# Patient Record
Sex: Male | Born: 1935 | Race: White | Hispanic: No | Marital: Married | Smoking: Former smoker
Health system: Southern US, Community
[De-identification: ages and names within clinical notes are randomized; demographics above are authoritative.]

## PROBLEM LIST (undated history)

## (undated) DIAGNOSIS — N529 Male erectile dysfunction, unspecified: Secondary | ICD-10-CM

## (undated) DIAGNOSIS — I25810 Atherosclerosis of coronary artery bypass graft(s) without angina pectoris: Secondary | ICD-10-CM

## (undated) DIAGNOSIS — Z7901 Long term (current) use of anticoagulants: Secondary | ICD-10-CM

## (undated) DIAGNOSIS — E291 Testicular hypofunction: Secondary | ICD-10-CM

## (undated) DIAGNOSIS — K219 Gastro-esophageal reflux disease without esophagitis: Secondary | ICD-10-CM

## (undated) DIAGNOSIS — I639 Cerebral infarction, unspecified: Secondary | ICD-10-CM

## (undated) DIAGNOSIS — E039 Hypothyroidism, unspecified: Secondary | ICD-10-CM

## (undated) DIAGNOSIS — I10 Essential (primary) hypertension: Secondary | ICD-10-CM

## (undated) DIAGNOSIS — Q245 Malformation of coronary vessels: Secondary | ICD-10-CM

## (undated) DIAGNOSIS — B029 Zoster without complications: Secondary | ICD-10-CM

## (undated) DIAGNOSIS — C439 Malignant melanoma of skin, unspecified: Secondary | ICD-10-CM

## (undated) DIAGNOSIS — I4891 Unspecified atrial fibrillation: Secondary | ICD-10-CM

## (undated) DIAGNOSIS — E785 Hyperlipidemia, unspecified: Secondary | ICD-10-CM

## (undated) DIAGNOSIS — I482 Chronic atrial fibrillation, unspecified: Secondary | ICD-10-CM

## (undated) DIAGNOSIS — H9190 Unspecified hearing loss, unspecified ear: Secondary | ICD-10-CM

## (undated) DIAGNOSIS — Z950 Presence of cardiac pacemaker: Secondary | ICD-10-CM

## (undated) DIAGNOSIS — N400 Enlarged prostate without lower urinary tract symptoms: Secondary | ICD-10-CM

## (undated) HISTORY — DX: Long term (current) use of anticoagulants: Z79.01

## (undated) HISTORY — DX: Malignant melanoma of skin, unspecified: C43.9

## (undated) HISTORY — DX: Hypothyroidism, unspecified: E03.9

## (undated) HISTORY — DX: Chronic atrial fibrillation, unspecified: I48.20

## (undated) HISTORY — DX: Zoster without complications: B02.9

## (undated) HISTORY — DX: Atherosclerosis of coronary artery bypass graft(s) without angina pectoris: I25.810

## (undated) HISTORY — DX: Unspecified hearing loss, unspecified ear: H91.90

## (undated) HISTORY — DX: Benign prostatic hyperplasia without lower urinary tract symptoms: N40.0

## (undated) HISTORY — DX: Male erectile dysfunction, unspecified: N52.9

## (undated) HISTORY — DX: Essential (primary) hypertension: I10

## (undated) HISTORY — DX: Malformation of coronary vessels: Q24.5

## (undated) HISTORY — DX: Testicular hypofunction: E29.1

## (undated) HISTORY — DX: Hyperlipidemia, unspecified: E78.5

## (undated) HISTORY — DX: Cerebral infarction, unspecified: I63.9

## (undated) HISTORY — DX: Gastro-esophageal reflux disease without esophagitis: K21.9

## (undated) HISTORY — DX: Unspecified atrial fibrillation: I48.91

## (undated) HISTORY — DX: Presence of cardiac pacemaker: Z95.0

---

## 1992-03-03 DIAGNOSIS — I25810 Atherosclerosis of coronary artery bypass graft(s) without angina pectoris: Secondary | ICD-10-CM

## 1992-03-03 HISTORY — DX: Atherosclerosis of coronary artery bypass graft(s) without angina pectoris: I25.810

## 1993-03-03 DIAGNOSIS — I639 Cerebral infarction, unspecified: Secondary | ICD-10-CM

## 1993-03-03 HISTORY — DX: Cerebral infarction, unspecified: I63.9

## 1998-03-03 DIAGNOSIS — Q245 Malformation of coronary vessels: Secondary | ICD-10-CM

## 1998-03-03 HISTORY — DX: Malformation of coronary vessels: Q24.5

## 1998-07-05 ENCOUNTER — Ambulatory Visit (HOSPITAL_COMMUNITY): Admission: AD | Admit: 1998-07-05 | Discharge: 1998-07-05 | Payer: Self-pay | Admitting: Interventional Cardiology

## 1998-07-06 ENCOUNTER — Observation Stay (HOSPITAL_COMMUNITY): Admission: AD | Admit: 1998-07-06 | Discharge: 1998-07-07 | Payer: Self-pay | Admitting: Interventional Cardiology

## 1999-01-21 ENCOUNTER — Ambulatory Visit (HOSPITAL_COMMUNITY): Admission: RE | Admit: 1999-01-21 | Discharge: 1999-01-21 | Payer: Self-pay | Admitting: Interventional Cardiology

## 2000-03-16 ENCOUNTER — Ambulatory Visit (HOSPITAL_COMMUNITY): Admission: AD | Admit: 2000-03-16 | Discharge: 2000-03-16 | Payer: Self-pay | Admitting: Cardiology

## 2001-11-26 ENCOUNTER — Ambulatory Visit (HOSPITAL_COMMUNITY): Admission: RE | Admit: 2001-11-26 | Discharge: 2001-11-26 | Payer: Self-pay | Admitting: Gastroenterology

## 2001-11-26 ENCOUNTER — Encounter (INDEPENDENT_AMBULATORY_CARE_PROVIDER_SITE_OTHER): Payer: Self-pay | Admitting: Specialist

## 2005-07-02 ENCOUNTER — Encounter: Payer: Self-pay | Admitting: Interventional Cardiology

## 2007-07-07 ENCOUNTER — Ambulatory Visit (HOSPITAL_COMMUNITY): Admission: RE | Admit: 2007-07-07 | Discharge: 2007-07-07 | Payer: Self-pay | Admitting: Cardiology

## 2010-05-10 ENCOUNTER — Other Ambulatory Visit: Payer: Self-pay | Admitting: Dermatology

## 2010-07-16 NOTE — Cardiovascular Report (Signed)
Timothy Harper, WHANG NO.:  [id number]   MEDICAL RECORD NO.:  [id number]          PATIENT TYPE:  OIB   LOCATION:  2899                         FACILITY:  [facility]   PHYSICIAN:  Francisca December, M.D.  DATE OF BIRTH:  05-Jun-1932   DATE OF PROCEDURE:  DATE OF DISCHARGE:                            CARDIAC CATHETERIZATION   PROCEDURE PERFORMED:  1. Explant old pacing generator.  2. Insert a new permanent dual-chamber permanent transvenous      pacemaker.   INDICATIONS:  The patient is a 77 year old man with a Medtronic  pacemaker model #[id number], serial [id number] implanted on March 16, 2000, now at the [facility].  Initial diagnosis was atrial fibrillation with  tachybrady syndrome.  This is his third device.  The initial device was  implanted in 1995.   PROCEDURE NOTE:  The patient is brought to cardiac catheterization  laboratory in the fasting state.  The left prepectoral region was  prepped and draped in the usual sterile fashion.  Local anesthesia was  obtained with infiltration of 1% lidocaine with epinephrine throughout  the region above the pacemaker and along the incision site.   A 6-7 cm incision was then made over the previous incision site and this  was carried down by sharp and blunt dissection as well as electrocautery  to the pacemaker capsule.  The capsule was incised and delivered with  some difficulty.  There was extensive scarring.  The pacemaker was  detached in the pacing leads and the leads were tested for adequate  pacing parameters.  This is reported below.  The pocket was then  copiously irrigated using 1% kanamycin solution.  The new pacing  generator was attached to the pacing leads, carefully identifying each  by serial number and placing each into the probe receptacle.  Each lead  was tightened into place and tested for security.  The pacing generator  was then replaced into the pocket without any need for pocket revision  or enlargement.   The pocket was then closed using 2-0 Vicryl in a  running fashion with subcutaneous layer.  The skin was approximated  using 4-0 Vicryl in a running subcuticular fashion.  Steri-Strips and  sterile dressing were applied.  The patient is transported to the  recovery area in stable condition.   EQUIPMENT DATA:  The explanted device is as noted above.  The new  implanted device is a Medtronic Adapta [id number] #1 [id number].   PACING DATA:  The ventricular lead detected an 8.5 mV R wave.  The  pacing threshold was 1.2 volts at 0.5 milliseconds pulse width.  The  impedance was 565 ohms resulting in a current at capture threshold of  2.5 MA.  The atrial lead detected a 5.1 mV P-wave.  The pacing threshold  was 1.1 volts at 0.5 milliseconds pulse width.  The impedance was 585  ohms resulting in a current and capture threshold of 2.9 MA.      Francisca December, M.D.  Electronically Signed     JHE/MEDQ  D:  07/07/2007  T:  07/08/2007  Job:  [id number]

## 2010-07-19 NOTE — Procedures (Signed)
[facility]  Patient:    SUTTON, HIRSCH                   MRN: [id number] Proc. Date: 03/16/00 Adm. Date:  [id number] Disc. Date: [id number] Attending:  Armanda Magic CC:         Darci Needle, M.D.             Thora Lance, M.D.                           Procedure Report  INDICATIONS:  This is a 77 year old white male patient followed by Darci Needle, M.D., with a history of coronary artery disease, status post PTCA and stent to the right coronary artery in May 2000.  He also is status post coronary artery bypass grafting in 1994.  He has a history of tachybrady syndrome with a history of paroxysmal atrial fibrillation and is status post DDD pacemaker placement in 1991.  He was found recently to have elective replacement of his batter generator and now presents for generator change-out.  DESCRIPTION OF PROCEDURE:  The patient was brought to the cardiac catheterization lab in the fasting, nonsedated state.  Informed consent was obtained.  The patient was connected to continuous heart rate and pulse oximetry monitoring and intermittent blood pressure monitoring.  The left chest was prepped and draped in a sterile fashion.  Lidocaine 1% was used for local anesthesia.  After adequate anesthesia had been obtained and the patient was given adequate conscious sedation with 5 mg of Valium, an incision was made over the previous incision.  Using a combination of blunt dissection and electrocautery, the pacemaker pocket was opened to expose the battery generator.  After the generator was removed from the pocket, the leads were disconnected and the leads examined.  They appeared to be intact.  Testing of the leads was then performed.  The atrial threshold was 1.2 volts at 0.5 milliseconds.  Atrial lead impedance 522 Ohms.  P-wave width of 2.54 millivolts.  Current 3.1 MA.  Ventricular threshold was 1.3 volts at 0.5 milliseconds.  Ventricular  lead impedance 598 Ohms.  R-wave amplitude 7.1 millivolts and current 2.7 MA.  The atrial lead is a model number [id number], S/N, serial number [id number], initially implanted 03/29/93.  Ventricular lead was a model number [id number], serial number [id number], implanted 03/29/93.  The pocket was then washed out with a solution of polymyxin B and bacitracin.  After adequate hemostasis was obtained, a new generator, which was a Medtronic Kappa [id number], serial number [id number], was connected back to the pacemaker leads. The leads were placed underneath the generator, and the entire device was placed back into the pocket.  The pocket was then closed.  The fascial layer was closed with 0 silk.  The subcutaneous layers were closed with 3-0 and 2-0 Vicryl, and the subcuticular layer was closed with 5-0 Vicryl.  At the end of the procedure, the patient was transferred back to his room in stable condition.  PLAN:  He will be discharged later today.  Restart Coumadin in 24 hours.  He will have a repeat PT/INR check in one week and follow up with Dr. Mayford Knife in one week. DD:  04/02/00 TD:  04/02/00 Job: [id number]

## 2011-05-13 ENCOUNTER — Other Ambulatory Visit: Payer: Self-pay | Admitting: Dermatology

## 2011-05-13 DIAGNOSIS — L578 Other skin changes due to chronic exposure to nonionizing radiation: Secondary | ICD-10-CM | POA: Diagnosis not present

## 2011-05-13 DIAGNOSIS — I4891 Unspecified atrial fibrillation: Secondary | ICD-10-CM | POA: Diagnosis not present

## 2011-05-13 DIAGNOSIS — L570 Actinic keratosis: Secondary | ICD-10-CM | POA: Diagnosis not present

## 2011-06-16 DIAGNOSIS — E039 Hypothyroidism, unspecified: Secondary | ICD-10-CM | POA: Diagnosis not present

## 2011-06-16 DIAGNOSIS — Z1331 Encounter for screening for depression: Secondary | ICD-10-CM | POA: Diagnosis not present

## 2011-06-16 DIAGNOSIS — N529 Male erectile dysfunction, unspecified: Secondary | ICD-10-CM | POA: Diagnosis not present

## 2011-06-16 DIAGNOSIS — E291 Testicular hypofunction: Secondary | ICD-10-CM | POA: Diagnosis not present

## 2011-06-16 DIAGNOSIS — I10 Essential (primary) hypertension: Secondary | ICD-10-CM | POA: Diagnosis not present

## 2011-06-16 DIAGNOSIS — K219 Gastro-esophageal reflux disease without esophagitis: Secondary | ICD-10-CM | POA: Diagnosis not present

## 2011-06-16 DIAGNOSIS — N400 Enlarged prostate without lower urinary tract symptoms: Secondary | ICD-10-CM | POA: Diagnosis not present

## 2011-06-16 DIAGNOSIS — E785 Hyperlipidemia, unspecified: Secondary | ICD-10-CM | POA: Diagnosis not present

## 2011-06-18 DIAGNOSIS — I10 Essential (primary) hypertension: Secondary | ICD-10-CM | POA: Diagnosis not present

## 2011-06-30 DIAGNOSIS — L821 Other seborrheic keratosis: Secondary | ICD-10-CM | POA: Diagnosis not present

## 2011-06-30 DIAGNOSIS — L570 Actinic keratosis: Secondary | ICD-10-CM | POA: Diagnosis not present

## 2011-06-30 DIAGNOSIS — Z85828 Personal history of other malignant neoplasm of skin: Secondary | ICD-10-CM | POA: Diagnosis not present

## 2011-06-30 DIAGNOSIS — L578 Other skin changes due to chronic exposure to nonionizing radiation: Secondary | ICD-10-CM | POA: Diagnosis not present

## 2011-06-30 DIAGNOSIS — I4891 Unspecified atrial fibrillation: Secondary | ICD-10-CM | POA: Diagnosis not present

## 2011-07-01 DIAGNOSIS — I2510 Atherosclerotic heart disease of native coronary artery without angina pectoris: Secondary | ICD-10-CM | POA: Diagnosis not present

## 2011-07-01 DIAGNOSIS — R413 Other amnesia: Secondary | ICD-10-CM | POA: Diagnosis not present

## 2011-07-01 DIAGNOSIS — Z7901 Long term (current) use of anticoagulants: Secondary | ICD-10-CM | POA: Diagnosis not present

## 2011-07-01 DIAGNOSIS — Z950 Presence of cardiac pacemaker: Secondary | ICD-10-CM | POA: Diagnosis not present

## 2011-07-01 DIAGNOSIS — I10 Essential (primary) hypertension: Secondary | ICD-10-CM | POA: Diagnosis not present

## 2011-07-01 DIAGNOSIS — I5030 Unspecified diastolic (congestive) heart failure: Secondary | ICD-10-CM | POA: Diagnosis not present

## 2011-07-01 DIAGNOSIS — I4891 Unspecified atrial fibrillation: Secondary | ICD-10-CM | POA: Diagnosis not present

## 2011-08-28 DIAGNOSIS — Z7901 Long term (current) use of anticoagulants: Secondary | ICD-10-CM | POA: Diagnosis not present

## 2011-09-08 DIAGNOSIS — Z950 Presence of cardiac pacemaker: Secondary | ICD-10-CM | POA: Diagnosis not present

## 2011-09-29 DIAGNOSIS — L570 Actinic keratosis: Secondary | ICD-10-CM | POA: Diagnosis not present

## 2011-09-29 DIAGNOSIS — L578 Other skin changes due to chronic exposure to nonionizing radiation: Secondary | ICD-10-CM | POA: Diagnosis not present

## 2011-11-12 DIAGNOSIS — Z7901 Long term (current) use of anticoagulants: Secondary | ICD-10-CM | POA: Diagnosis not present

## 2011-11-21 DIAGNOSIS — Z23 Encounter for immunization: Secondary | ICD-10-CM | POA: Diagnosis not present

## 2011-12-15 DIAGNOSIS — Z950 Presence of cardiac pacemaker: Secondary | ICD-10-CM | POA: Diagnosis not present

## 2011-12-15 DIAGNOSIS — I10 Essential (primary) hypertension: Secondary | ICD-10-CM | POA: Diagnosis not present

## 2011-12-15 DIAGNOSIS — N529 Male erectile dysfunction, unspecified: Secondary | ICD-10-CM | POA: Diagnosis not present

## 2011-12-15 DIAGNOSIS — E291 Testicular hypofunction: Secondary | ICD-10-CM | POA: Diagnosis not present

## 2011-12-29 DIAGNOSIS — L819 Disorder of pigmentation, unspecified: Secondary | ICD-10-CM | POA: Diagnosis not present

## 2011-12-29 DIAGNOSIS — L570 Actinic keratosis: Secondary | ICD-10-CM | POA: Diagnosis not present

## 2011-12-29 DIAGNOSIS — L821 Other seborrheic keratosis: Secondary | ICD-10-CM | POA: Diagnosis not present

## 2011-12-29 DIAGNOSIS — L578 Other skin changes due to chronic exposure to nonionizing radiation: Secondary | ICD-10-CM | POA: Diagnosis not present

## 2012-01-07 DIAGNOSIS — Z7901 Long term (current) use of anticoagulants: Secondary | ICD-10-CM | POA: Diagnosis not present

## 2012-01-08 DIAGNOSIS — Z7901 Long term (current) use of anticoagulants: Secondary | ICD-10-CM | POA: Diagnosis not present

## 2012-02-23 DIAGNOSIS — H40019 Open angle with borderline findings, low risk, unspecified eye: Secondary | ICD-10-CM | POA: Diagnosis not present

## 2012-04-02 DIAGNOSIS — L819 Disorder of pigmentation, unspecified: Secondary | ICD-10-CM | POA: Diagnosis not present

## 2012-04-02 DIAGNOSIS — L570 Actinic keratosis: Secondary | ICD-10-CM | POA: Diagnosis not present

## 2012-04-02 DIAGNOSIS — L578 Other skin changes due to chronic exposure to nonionizing radiation: Secondary | ICD-10-CM | POA: Diagnosis not present

## 2012-04-02 DIAGNOSIS — L821 Other seborrheic keratosis: Secondary | ICD-10-CM | POA: Diagnosis not present

## 2012-04-06 DIAGNOSIS — I4891 Unspecified atrial fibrillation: Secondary | ICD-10-CM | POA: Diagnosis not present

## 2012-04-06 DIAGNOSIS — Z950 Presence of cardiac pacemaker: Secondary | ICD-10-CM | POA: Diagnosis not present

## 2012-05-28 DIAGNOSIS — I4891 Unspecified atrial fibrillation: Secondary | ICD-10-CM | POA: Diagnosis not present

## 2012-06-28 DIAGNOSIS — L578 Other skin changes due to chronic exposure to nonionizing radiation: Secondary | ICD-10-CM | POA: Diagnosis not present

## 2012-06-28 DIAGNOSIS — L821 Other seborrheic keratosis: Secondary | ICD-10-CM | POA: Diagnosis not present

## 2012-06-28 DIAGNOSIS — L219 Seborrheic dermatitis, unspecified: Secondary | ICD-10-CM | POA: Diagnosis not present

## 2012-06-28 DIAGNOSIS — Z85828 Personal history of other malignant neoplasm of skin: Secondary | ICD-10-CM | POA: Diagnosis not present

## 2012-06-28 DIAGNOSIS — L738 Other specified follicular disorders: Secondary | ICD-10-CM | POA: Diagnosis not present

## 2012-06-28 DIAGNOSIS — L570 Actinic keratosis: Secondary | ICD-10-CM | POA: Diagnosis not present

## 2012-06-28 DIAGNOSIS — L819 Disorder of pigmentation, unspecified: Secondary | ICD-10-CM | POA: Diagnosis not present

## 2012-06-28 DIAGNOSIS — D1801 Hemangioma of skin and subcutaneous tissue: Secondary | ICD-10-CM | POA: Diagnosis not present

## 2012-06-30 DIAGNOSIS — E291 Testicular hypofunction: Secondary | ICD-10-CM | POA: Diagnosis not present

## 2012-06-30 DIAGNOSIS — Z0000 Encounter for general adult medical examination without abnormal findings: Secondary | ICD-10-CM | POA: Diagnosis not present

## 2012-06-30 DIAGNOSIS — Z7901 Long term (current) use of anticoagulants: Secondary | ICD-10-CM | POA: Diagnosis not present

## 2012-06-30 DIAGNOSIS — E785 Hyperlipidemia, unspecified: Secondary | ICD-10-CM | POA: Diagnosis not present

## 2012-06-30 DIAGNOSIS — Z1331 Encounter for screening for depression: Secondary | ICD-10-CM | POA: Diagnosis not present

## 2012-06-30 DIAGNOSIS — N400 Enlarged prostate without lower urinary tract symptoms: Secondary | ICD-10-CM | POA: Diagnosis not present

## 2012-06-30 DIAGNOSIS — E039 Hypothyroidism, unspecified: Secondary | ICD-10-CM | POA: Diagnosis not present

## 2012-06-30 DIAGNOSIS — I10 Essential (primary) hypertension: Secondary | ICD-10-CM | POA: Diagnosis not present

## 2012-08-04 DIAGNOSIS — I10 Essential (primary) hypertension: Secondary | ICD-10-CM | POA: Diagnosis not present

## 2012-08-04 DIAGNOSIS — Z7901 Long term (current) use of anticoagulants: Secondary | ICD-10-CM | POA: Diagnosis not present

## 2012-08-04 DIAGNOSIS — Z950 Presence of cardiac pacemaker: Secondary | ICD-10-CM | POA: Diagnosis not present

## 2012-08-04 DIAGNOSIS — I4891 Unspecified atrial fibrillation: Secondary | ICD-10-CM | POA: Diagnosis not present

## 2012-08-04 DIAGNOSIS — E785 Hyperlipidemia, unspecified: Secondary | ICD-10-CM | POA: Diagnosis not present

## 2012-08-04 DIAGNOSIS — I2510 Atherosclerotic heart disease of native coronary artery without angina pectoris: Secondary | ICD-10-CM | POA: Diagnosis not present

## 2012-08-05 DIAGNOSIS — Z7901 Long term (current) use of anticoagulants: Secondary | ICD-10-CM | POA: Diagnosis not present

## 2012-08-20 DIAGNOSIS — L578 Other skin changes due to chronic exposure to nonionizing radiation: Secondary | ICD-10-CM | POA: Diagnosis not present

## 2012-08-20 DIAGNOSIS — L570 Actinic keratosis: Secondary | ICD-10-CM | POA: Diagnosis not present

## 2012-08-20 DIAGNOSIS — L820 Inflamed seborrheic keratosis: Secondary | ICD-10-CM | POA: Diagnosis not present

## 2012-08-20 DIAGNOSIS — Z85828 Personal history of other malignant neoplasm of skin: Secondary | ICD-10-CM | POA: Diagnosis not present

## 2012-08-20 DIAGNOSIS — L821 Other seborrheic keratosis: Secondary | ICD-10-CM | POA: Diagnosis not present

## 2012-09-09 DIAGNOSIS — Z7901 Long term (current) use of anticoagulants: Secondary | ICD-10-CM | POA: Diagnosis not present

## 2012-10-27 DIAGNOSIS — Z7901 Long term (current) use of anticoagulants: Secondary | ICD-10-CM | POA: Diagnosis not present

## 2012-11-03 DIAGNOSIS — Z950 Presence of cardiac pacemaker: Secondary | ICD-10-CM | POA: Diagnosis not present

## 2012-11-03 DIAGNOSIS — I495 Sick sinus syndrome: Secondary | ICD-10-CM | POA: Diagnosis not present

## 2012-11-19 ENCOUNTER — Other Ambulatory Visit: Payer: Self-pay | Admitting: Dermatology

## 2012-11-19 DIAGNOSIS — D045 Carcinoma in situ of skin of trunk: Secondary | ICD-10-CM | POA: Diagnosis not present

## 2012-11-19 DIAGNOSIS — Z85828 Personal history of other malignant neoplasm of skin: Secondary | ICD-10-CM | POA: Diagnosis not present

## 2012-11-19 DIAGNOSIS — L259 Unspecified contact dermatitis, unspecified cause: Secondary | ICD-10-CM | POA: Diagnosis not present

## 2012-11-19 DIAGNOSIS — L570 Actinic keratosis: Secondary | ICD-10-CM | POA: Diagnosis not present

## 2012-11-19 DIAGNOSIS — L821 Other seborrheic keratosis: Secondary | ICD-10-CM | POA: Diagnosis not present

## 2012-11-19 DIAGNOSIS — K130 Diseases of lips: Secondary | ICD-10-CM | POA: Diagnosis not present

## 2012-11-22 ENCOUNTER — Other Ambulatory Visit: Payer: Self-pay | Admitting: Dermatology

## 2012-11-22 DIAGNOSIS — D485 Neoplasm of uncertain behavior of skin: Secondary | ICD-10-CM | POA: Diagnosis not present

## 2012-11-22 DIAGNOSIS — L909 Atrophic disorder of skin, unspecified: Secondary | ICD-10-CM | POA: Diagnosis not present

## 2012-11-22 DIAGNOSIS — Z85828 Personal history of other malignant neoplasm of skin: Secondary | ICD-10-CM | POA: Diagnosis not present

## 2012-12-02 DIAGNOSIS — Z7901 Long term (current) use of anticoagulants: Secondary | ICD-10-CM | POA: Diagnosis not present

## 2012-12-04 ENCOUNTER — Other Ambulatory Visit: Payer: Self-pay | Admitting: Interventional Cardiology

## 2012-12-13 DIAGNOSIS — Z23 Encounter for immunization: Secondary | ICD-10-CM | POA: Diagnosis not present

## 2012-12-29 DIAGNOSIS — I10 Essential (primary) hypertension: Secondary | ICD-10-CM | POA: Diagnosis not present

## 2012-12-29 DIAGNOSIS — E291 Testicular hypofunction: Secondary | ICD-10-CM | POA: Diagnosis not present

## 2012-12-29 DIAGNOSIS — E785 Hyperlipidemia, unspecified: Secondary | ICD-10-CM | POA: Diagnosis not present

## 2012-12-29 DIAGNOSIS — Z7901 Long term (current) use of anticoagulants: Secondary | ICD-10-CM | POA: Diagnosis not present

## 2012-12-29 DIAGNOSIS — I4891 Unspecified atrial fibrillation: Secondary | ICD-10-CM | POA: Diagnosis not present

## 2013-02-02 DIAGNOSIS — E785 Hyperlipidemia, unspecified: Secondary | ICD-10-CM | POA: Diagnosis not present

## 2013-02-02 DIAGNOSIS — Z7901 Long term (current) use of anticoagulants: Secondary | ICD-10-CM | POA: Diagnosis not present

## 2013-02-18 ENCOUNTER — Encounter: Payer: Self-pay | Admitting: *Deleted

## 2013-03-01 DIAGNOSIS — H40019 Open angle with borderline findings, low risk, unspecified eye: Secondary | ICD-10-CM | POA: Diagnosis not present

## 2013-03-01 DIAGNOSIS — Z0100 Encounter for examination of eyes and vision without abnormal findings: Secondary | ICD-10-CM | POA: Diagnosis not present

## 2013-03-18 DIAGNOSIS — Z7901 Long term (current) use of anticoagulants: Secondary | ICD-10-CM | POA: Diagnosis not present

## 2013-04-13 ENCOUNTER — Other Ambulatory Visit: Payer: Self-pay | Admitting: Dermatology

## 2013-04-13 DIAGNOSIS — C44211 Basal cell carcinoma of skin of unspecified ear and external auricular canal: Secondary | ICD-10-CM | POA: Diagnosis not present

## 2013-04-13 DIAGNOSIS — C44319 Basal cell carcinoma of skin of other parts of face: Secondary | ICD-10-CM | POA: Diagnosis not present

## 2013-04-13 DIAGNOSIS — L570 Actinic keratosis: Secondary | ICD-10-CM | POA: Diagnosis not present

## 2013-04-13 DIAGNOSIS — L821 Other seborrheic keratosis: Secondary | ICD-10-CM | POA: Diagnosis not present

## 2013-04-13 DIAGNOSIS — Z85828 Personal history of other malignant neoplasm of skin: Secondary | ICD-10-CM | POA: Diagnosis not present

## 2013-05-05 DIAGNOSIS — Z5181 Encounter for therapeutic drug level monitoring: Secondary | ICD-10-CM | POA: Diagnosis not present

## 2013-05-05 DIAGNOSIS — Z7901 Long term (current) use of anticoagulants: Secondary | ICD-10-CM | POA: Diagnosis not present

## 2013-05-25 DIAGNOSIS — Z85828 Personal history of other malignant neoplasm of skin: Secondary | ICD-10-CM | POA: Diagnosis not present

## 2013-05-25 DIAGNOSIS — C44211 Basal cell carcinoma of skin of unspecified ear and external auricular canal: Secondary | ICD-10-CM | POA: Diagnosis not present

## 2013-06-22 DIAGNOSIS — I4891 Unspecified atrial fibrillation: Secondary | ICD-10-CM | POA: Diagnosis not present

## 2013-06-22 DIAGNOSIS — Z7901 Long term (current) use of anticoagulants: Secondary | ICD-10-CM | POA: Diagnosis not present

## 2013-06-30 DIAGNOSIS — Z23 Encounter for immunization: Secondary | ICD-10-CM | POA: Diagnosis not present

## 2013-06-30 DIAGNOSIS — I10 Essential (primary) hypertension: Secondary | ICD-10-CM | POA: Diagnosis not present

## 2013-06-30 DIAGNOSIS — E785 Hyperlipidemia, unspecified: Secondary | ICD-10-CM | POA: Diagnosis not present

## 2013-06-30 DIAGNOSIS — K219 Gastro-esophageal reflux disease without esophagitis: Secondary | ICD-10-CM | POA: Diagnosis not present

## 2013-06-30 DIAGNOSIS — E039 Hypothyroidism, unspecified: Secondary | ICD-10-CM | POA: Diagnosis not present

## 2013-06-30 DIAGNOSIS — N529 Male erectile dysfunction, unspecified: Secondary | ICD-10-CM | POA: Diagnosis not present

## 2013-06-30 DIAGNOSIS — E291 Testicular hypofunction: Secondary | ICD-10-CM | POA: Diagnosis not present

## 2013-06-30 DIAGNOSIS — Z79899 Other long term (current) drug therapy: Secondary | ICD-10-CM | POA: Diagnosis not present

## 2013-07-11 DIAGNOSIS — L821 Other seborrheic keratosis: Secondary | ICD-10-CM | POA: Diagnosis not present

## 2013-07-11 DIAGNOSIS — L570 Actinic keratosis: Secondary | ICD-10-CM | POA: Diagnosis not present

## 2013-07-11 DIAGNOSIS — D239 Other benign neoplasm of skin, unspecified: Secondary | ICD-10-CM | POA: Diagnosis not present

## 2013-07-11 DIAGNOSIS — D1801 Hemangioma of skin and subcutaneous tissue: Secondary | ICD-10-CM | POA: Diagnosis not present

## 2013-07-11 DIAGNOSIS — Z85828 Personal history of other malignant neoplasm of skin: Secondary | ICD-10-CM | POA: Diagnosis not present

## 2013-07-26 DIAGNOSIS — E039 Hypothyroidism, unspecified: Secondary | ICD-10-CM | POA: Diagnosis not present

## 2013-07-26 DIAGNOSIS — Z125 Encounter for screening for malignant neoplasm of prostate: Secondary | ICD-10-CM | POA: Diagnosis not present

## 2013-07-26 DIAGNOSIS — I10 Essential (primary) hypertension: Secondary | ICD-10-CM | POA: Diagnosis not present

## 2013-07-26 DIAGNOSIS — Z79899 Other long term (current) drug therapy: Secondary | ICD-10-CM | POA: Diagnosis not present

## 2013-07-26 DIAGNOSIS — I4891 Unspecified atrial fibrillation: Secondary | ICD-10-CM | POA: Diagnosis not present

## 2013-07-26 DIAGNOSIS — E785 Hyperlipidemia, unspecified: Secondary | ICD-10-CM | POA: Diagnosis not present

## 2013-07-26 DIAGNOSIS — Z7901 Long term (current) use of anticoagulants: Secondary | ICD-10-CM | POA: Diagnosis not present

## 2013-08-02 ENCOUNTER — Encounter: Payer: Self-pay | Admitting: *Deleted

## 2013-08-03 ENCOUNTER — Ambulatory Visit: Payer: Self-pay | Admitting: Interventional Cardiology

## 2013-08-04 ENCOUNTER — Encounter: Payer: Self-pay | Admitting: Interventional Cardiology

## 2013-08-04 ENCOUNTER — Ambulatory Visit (INDEPENDENT_AMBULATORY_CARE_PROVIDER_SITE_OTHER): Payer: Medicare Other | Admitting: Interventional Cardiology

## 2013-08-04 ENCOUNTER — Encounter (INDEPENDENT_AMBULATORY_CARE_PROVIDER_SITE_OTHER): Payer: Self-pay

## 2013-08-04 ENCOUNTER — Ambulatory Visit (INDEPENDENT_AMBULATORY_CARE_PROVIDER_SITE_OTHER): Payer: Medicare Other | Admitting: *Deleted

## 2013-08-04 VITALS — BP 110/80 | HR 61 | Ht 71.0 in | Wt 215.0 lb

## 2013-08-04 DIAGNOSIS — I4891 Unspecified atrial fibrillation: Secondary | ICD-10-CM

## 2013-08-04 DIAGNOSIS — I482 Chronic atrial fibrillation, unspecified: Secondary | ICD-10-CM

## 2013-08-04 DIAGNOSIS — I495 Sick sinus syndrome: Secondary | ICD-10-CM

## 2013-08-04 DIAGNOSIS — I639 Cerebral infarction, unspecified: Secondary | ICD-10-CM

## 2013-08-04 DIAGNOSIS — Z950 Presence of cardiac pacemaker: Secondary | ICD-10-CM

## 2013-08-04 DIAGNOSIS — I6340 Cerebral infarction due to embolism of unspecified cerebral artery: Secondary | ICD-10-CM

## 2013-08-04 DIAGNOSIS — I25810 Atherosclerosis of coronary artery bypass graft(s) without angina pectoris: Secondary | ICD-10-CM

## 2013-08-04 DIAGNOSIS — I10 Essential (primary) hypertension: Secondary | ICD-10-CM

## 2013-08-04 LAB — MDC_IDC_ENUM_SESS_TYPE_INCLINIC
Battery Impedance: 360 Ohm
Battery Voltage: 2.78 V
Date Time Interrogation Session: 20150604132348
Lead Channel Impedance Value: 626 Ohm
Lead Channel Impedance Value: 67 Ohm
Lead Channel Pacing Threshold Amplitude: 0.75 V
Lead Channel Pacing Threshold Pulse Width: 0.4 ms
Lead Channel Sensing Intrinsic Amplitude: 11.2 mV
MDC IDC MSMT BATTERY REMAINING LONGEVITY: 126 mo
MDC IDC SET LEADCHNL RV PACING AMPLITUDE: 2.5 V
MDC IDC SET LEADCHNL RV PACING PULSEWIDTH: 0.4 ms
MDC IDC SET LEADCHNL RV SENSING SENSITIVITY: 4 mV
MDC IDC STAT BRADY RV PERCENT PACED: 50 %

## 2013-08-04 NOTE — Progress Notes (Signed)
Patient ID: Timothy Harper, male   DOB: [birth year], 80 y.o.   MRN: [id number]    [address], [place],   [address] Phone: [phone] Fax:  [phone]  Date:  08/04/2013   ID:  Timothy Harper, DOB 11-24-1932, MRN [id number]  PCP:  Irven Shelling, MD   ASSESSMENT:  1. Coronary atherosclerotic heart disease, stable with prior the LIMA to RCA and stent to LAD. Asymptomatic 2. Chronic atrial fibrillation 3. Chronic anticoagulation 4. Hypertension 5. Permanent pacemaker  PLAN:  1. Overall [name] well. He has no cardiovascular complaints. 2. Continue to followup in Coumadin and device clinic 3. Continue active lifestyle 4. Clinical followup in one year   SUBJECTIVE: Timothy Harper is a 80 y.o. male is doing well. He denies angina, neurological complaints, and bleeding on anticoagulation therapy. No episodes of syncope. He is actively engaging in his avocation of golf. He denies orthopnea and PND.   Wt Readings from Last 3 Encounters:  08/04/13 215 lb (97.523 kg)     Past Medical History  Diagnosis Date  . Coronary artery disease involving coronary bypass graft 1994    LIMA to RCA . Prior PCI on RCA with restenosis  . Anomalous origin of coronary artery 2000    Stent LAD   . Atrial fibrillation   . HTN (hypertension)   . Hypothyroidism   . Hyperlipemia   . CVA (cerebral infarction) 1995  . GERD (gastroesophageal reflux disease)   . BPH (benign prostatic hyperplasia)   . Erectile dysfunction   . Cardiac pacemaker in situ   . Long term (current) use of anticoagulants   . Primary male hypogonadism   . CVA (cerebral infarction)     Right  . Hearing loss     hearing aids  . Melanoma     right ear, Rosana Hoes 2002 and left arm 1997, [place]  . Herpes zoster     Current Outpatient Prescriptions  Medication Sig Dispense Refill  . DIGOX 250 MCG tablet TAKE 1 TABLET DAILY.  30 tablet  10  . diltiazem (TIAZAC) 180 MG 24  hr capsule Take 180 mg by mouth daily.      . furosemide (LASIX) 20 MG tablet Take 20 mg by mouth daily.      Marland Kitchen levothyroxine (SYNTHROID, LEVOTHROID) 112 MCG tablet Take 112 mcg by mouth daily before breakfast.      . Magnesium Oxide (MAG-200) 200 MG TABS Take 200 mg by mouth.      . metoprolol succinate (TOPROL-XL) 50 MG 24 hr tablet Take 50 mg by mouth 2 (two) times daily. Take with or immediately following a meal.      . omeprazole (PRILOSEC) 20 MG capsule Take 20 mg by mouth as needed.      . potassium chloride SA (K-DUR,KLOR-CON) 20 MEQ tablet Take 20 mEq by mouth daily.      . pravastatin (PRAVACHOL) 40 MG tablet Take 40 mg by mouth daily.      . sildenafil (VIAGRA) 100 MG tablet Take 100 mg by mouth daily as needed for erectile dysfunction.      . Testosterone (FORTESTA) 10 MG/ACT (2%) GEL Place onto the skin. 4 pumps daily      . vardenafil (LEVITRA) 20 MG tablet Take 20 mg by mouth daily as needed for erectile dysfunction.      Marland Kitchen warfarin (COUMADIN) 5 MG tablet Take 5 mg by mouth as needed.  No current facility-administered medications for this visit.    Allergies:    Allergies  Allergen Reactions  . Penicillins     Swollen face/lips  . Prednisone     swelling    Social History:  The patient  reports that he quit smoking about 25 years ago. He does not have any smokeless tobacco history on file. He reports that he drinks alcohol. He reports that he does not use illicit drugs.   ROS:  Please see the history of present illness.   Takes his medications as prescribed. He has not had claudication or edema.   All other systems reviewed and negative.   OBJECTIVE: VS:  BP 110/80  Pulse 61  Ht 5\' 11"  (1.803 m)  Wt 215 lb (97.523 kg)  BMI 30.00 kg/m2 Well nourished, well developed, in no acute distress, appears than stated age  30: normal Neck: JVD flat. Carotid bruit absent  Cardiac:  normal S1, S2; RRR; no murmur Lungs:  clear to auscultation bilaterally, no wheezing,  rhonchi or rales Abd: soft, nontender, no hepatomegaly Ext: Edema absent. Pulses 2+ and symmetric  Skin: warm and dry Neuro:  CNs 2-12 intact, no focal abnormalities noted  EKG:  Baseline atrial fibrillation with ventricular pacing       Signed, Illene Labrador III, MD 08/04/2013 9:58 AM

## 2013-08-04 NOTE — Patient Instructions (Addendum)
Your physician recommends that you continue on your current medications as directed. Please refer to the Current Medication list given to you today.  Your physician discussed the importance of regular exercise and recommended that you start or continue a regular exercise program for good health.   Your physician wants you to follow-up in: 1 year You will receive a reminder letter in the mail two months in advance. If you don't receive a letter, please call our office to schedule the follow-up appointment.  

## 2013-08-05 NOTE — Progress Notes (Signed)
Pacemaker check in clinic. Normal device function. Threshold, sensing, and impedance consistent with previous measurements. Device programmed to maximize longevity. 1,027 high ventricular rates noted---max dur. 3 mins 52 sec, Max V 274, Max Avg V 175---AF w/RVR. Device programmed at appropriate safety margins. Histogram distribution appropriate for patient activity level. Device programmed to optimize intrinsic conduction. Estimated longevity 10.5 years. Patient will follow up with GT in 3 months

## 2013-08-14 ENCOUNTER — Other Ambulatory Visit: Payer: Self-pay | Admitting: Interventional Cardiology

## 2013-08-18 ENCOUNTER — Encounter: Payer: Self-pay | Admitting: Internal Medicine

## 2013-09-23 ENCOUNTER — Other Ambulatory Visit: Payer: Self-pay | Admitting: Interventional Cardiology

## 2013-10-28 ENCOUNTER — Other Ambulatory Visit: Payer: Self-pay | Admitting: Interventional Cardiology

## 2013-11-02 ENCOUNTER — Encounter: Payer: Self-pay | Admitting: Internal Medicine

## 2013-11-02 ENCOUNTER — Ambulatory Visit (INDEPENDENT_AMBULATORY_CARE_PROVIDER_SITE_OTHER): Payer: Medicare Other | Admitting: Internal Medicine

## 2013-11-02 VITALS — BP 124/70 | HR 78 | Ht 71.0 in | Wt 217.0 lb

## 2013-11-02 DIAGNOSIS — I4891 Unspecified atrial fibrillation: Secondary | ICD-10-CM | POA: Diagnosis not present

## 2013-11-02 DIAGNOSIS — I482 Chronic atrial fibrillation, unspecified: Secondary | ICD-10-CM

## 2013-11-02 DIAGNOSIS — Z950 Presence of cardiac pacemaker: Secondary | ICD-10-CM | POA: Diagnosis not present

## 2013-11-02 DIAGNOSIS — I495 Sick sinus syndrome: Secondary | ICD-10-CM

## 2013-11-02 LAB — MDC_IDC_ENUM_SESS_TYPE_INCLINIC
Battery Impedance: 385 Ohm
Battery Voltage: 2.78 V
Brady Statistic RV Percent Paced: 53 %
Date Time Interrogation Session: 20150902122641
Lead Channel Impedance Value: 615 Ohm
Lead Channel Pacing Threshold Amplitude: 0.75 V
Lead Channel Pacing Threshold Pulse Width: 0.27 ms
Lead Channel Pacing Threshold Pulse Width: 0.4 ms
Lead Channel Sensing Intrinsic Amplitude: 11.2 mV
Lead Channel Setting Pacing Pulse Width: 0.4 ms
MDC IDC MSMT BATTERY REMAINING LONGEVITY: 110 mo
MDC IDC MSMT LEADCHNL RA IMPEDANCE VALUE: 67 Ohm
MDC IDC MSMT LEADCHNL RV PACING THRESHOLD AMPLITUDE: 1.75 V
MDC IDC SET LEADCHNL RV PACING AMPLITUDE: 2.5 V
MDC IDC SET LEADCHNL RV SENSING SENSITIVITY: 4 mV

## 2013-11-02 NOTE — Assessment & Plan Note (Signed)
His chronic atrial fib is well controlled. No change in meds.

## 2013-11-02 NOTE — Progress Notes (Signed)
HPI [name] is referred today for ongoing evaluation and management of his PPM. He has a h/o sinus node dysfunction and now atrial fibrillation. He underwent his initial PPM insertion in 1994 with subsequent change outs. He has chronic atrial fibrillation. He has rate control. He has mild LV dysfunction by echo. He is fairly sedentary but does not have peripheral edema or chest pain. Minimal if any dyspnea on exertion.  Allergies  Allergen Reactions  . Penicillins     Swollen face/lips  . Prednisone     swelling     Current Outpatient Prescriptions  Medication Sig Dispense Refill  . DIGOX 250 MCG tablet TAKE 1 TABLET DAILY.  30 tablet  10  . diltiazem (TIAZAC) 180 MG 24 hr capsule Take 180 mg by mouth daily.      . furosemide (LASIX) 20 MG tablet TAKE 1 TABLET ONCE DAILY.  30 tablet  5  . levothyroxine (SYNTHROID, LEVOTHROID) 112 MCG tablet Take 112 mcg by mouth daily before breakfast.      . Magnesium Oxide (MAG-200) 200 MG TABS Take 200 mg by mouth.      . metoprolol succinate (TOPROL-XL) 50 MG 24 hr tablet TAKE 1 TABLET TWICE DAILY.  60 tablet  5  . omeprazole (PRILOSEC) 20 MG capsule Take 20 mg by mouth as needed.      . potassium chloride SA (K-DUR,KLOR-CON) 20 MEQ tablet TAKE 1 TABLET ONCE DAILY.  30 tablet  10  . pravastatin (PRAVACHOL) 40 MG tablet Take 40 mg by mouth daily.      . sildenafil (VIAGRA) 100 MG tablet Take 100 mg by mouth daily as needed for erectile dysfunction.      . Testosterone (FORTESTA) 10 MG/ACT (2%) GEL Place onto the skin. 4 pumps daily      . vardenafil (LEVITRA) 20 MG tablet Take 20 mg by mouth daily as needed for erectile dysfunction.      Marland Kitchen warfarin (COUMADIN) 5 MG tablet Take 5 mg by mouth as needed.       No current facility-administered medications for this visit.     Past Medical History  Diagnosis Date  . Coronary artery disease involving coronary bypass graft 1994    LIMA to RCA . Prior PCI on RCA with restenosis  .  Anomalous origin of coronary artery 2000    Stent LAD   . Atrial fibrillation   . HTN (hypertension)   . Hypothyroidism   . Hyperlipemia   . CVA (cerebral infarction) 1995  . GERD (gastroesophageal reflux disease)   . BPH (benign prostatic hyperplasia)   . Erectile dysfunction   . Cardiac pacemaker in situ   . Long term (current) use of anticoagulants   . Primary male hypogonadism   . CVA (cerebral infarction)     Right  . Hearing loss     hearing aids  . Melanoma     right ear, Rosana Hoes 2002 and left arm 1997, [place]  . Herpes zoster   . Chronic atrial fibrillation     ROS:   All systems reviewed and negative except as noted in the HPI.   History reviewed. No pertinent past surgical history.   Family History  Problem Relation Age of Onset  . Heart disease Mother      History   Social History  . Marital Status: Married    Spouse Name: N/A    Number of Children: 4  . Years of Education: N/A  Occupational History  . financial advisor     retired   Social History Main Topics  . Smoking status: Former Smoker    Quit date: 08/01/1988  . Smokeless tobacco: Not on file  . Alcohol Use: Yes     Comment: 2-3 times a week  . Drug Use: No  . Sexual Activity: Not on file   Other Topics Concern  . Not on file   Social History Narrative  . No narrative on file     BP 124/70  Pulse 78  Ht 5\' 11"  (1.803 m)  Wt 217 lb (98.431 kg)  BMI 30.28 kg/m2  SpO2 95%  Physical Exam:  Well appearing 80 yo man, NAD HEENT: Unremarkable Neck:  No JVD, no thyromegally Back:  No CVA tenderness Lungs:  Clear with no wheezes HEART:  IRegular rate rhythm, no murmurs, no rubs, no clicks Abd:  soft, positive bowel sounds, no organomegally, no rebound, no guarding Ext:  2 plus pulses, no edema, no cyanosis, no clubbing Skin:  No rashes no nodules Neuro:  CN II through XII intact, motor grossly intact  EKG - atrial fib with a controlled VR  DEVICE  Normal device  function.  See PaceArt for details.   Assess/Plan:

## 2013-11-02 NOTE — Assessment & Plan Note (Signed)
His Medtronic PM is working normally and programmed in the VVI mode. We will recheck in several months.

## 2013-11-02 NOTE — Patient Instructions (Signed)
Your physician recommends that you continue on your current medications as directed. Please refer to the Current Medication list given to you today.  Remote monitoring is used to monitor your Pacemaker of ICD from home. This monitoring reduces the number of office visits required to check your device to one time per year. It allows [place] to keep an eye on the functioning of your device to ensure it is working properly. You are scheduled for a device check from home on 02/06/14. You may send your transmission at any time that day. If you have a wireless device, the transmission will be sent automatically. After your physician reviews your transmission, you will receive a postcard with your next transmission date.  Your physician wants you to follow-up in: 1 year with Dr. Lovena Le.  You will receive a reminder letter in the mail two months in advance. If you don't receive a letter, please call our office to schedule the follow-up appointment.

## 2013-11-09 ENCOUNTER — Encounter: Payer: Self-pay | Admitting: Internal Medicine

## 2013-11-11 DIAGNOSIS — L570 Actinic keratosis: Secondary | ICD-10-CM | POA: Diagnosis not present

## 2013-11-11 DIAGNOSIS — L820 Inflamed seborrheic keratosis: Secondary | ICD-10-CM | POA: Diagnosis not present

## 2013-11-11 DIAGNOSIS — L821 Other seborrheic keratosis: Secondary | ICD-10-CM | POA: Diagnosis not present

## 2013-11-11 DIAGNOSIS — L708 Other acne: Secondary | ICD-10-CM | POA: Diagnosis not present

## 2013-11-11 DIAGNOSIS — Z85828 Personal history of other malignant neoplasm of skin: Secondary | ICD-10-CM | POA: Diagnosis not present

## 2013-11-11 DIAGNOSIS — L259 Unspecified contact dermatitis, unspecified cause: Secondary | ICD-10-CM | POA: Diagnosis not present

## 2013-11-23 DIAGNOSIS — I4891 Unspecified atrial fibrillation: Secondary | ICD-10-CM | POA: Diagnosis not present

## 2013-11-23 DIAGNOSIS — Z7901 Long term (current) use of anticoagulants: Secondary | ICD-10-CM | POA: Diagnosis not present

## 2013-11-23 DIAGNOSIS — Z23 Encounter for immunization: Secondary | ICD-10-CM | POA: Diagnosis not present

## 2013-12-08 DIAGNOSIS — J019 Acute sinusitis, unspecified: Secondary | ICD-10-CM | POA: Diagnosis not present

## 2013-12-08 DIAGNOSIS — J9801 Acute bronchospasm: Secondary | ICD-10-CM | POA: Diagnosis not present

## 2013-12-22 ENCOUNTER — Other Ambulatory Visit: Payer: Self-pay | Admitting: Interventional Cardiology

## 2013-12-23 DIAGNOSIS — Z7901 Long term (current) use of anticoagulants: Secondary | ICD-10-CM | POA: Diagnosis not present

## 2013-12-23 DIAGNOSIS — I4891 Unspecified atrial fibrillation: Secondary | ICD-10-CM | POA: Diagnosis not present

## 2014-01-05 DIAGNOSIS — N400 Enlarged prostate without lower urinary tract symptoms: Secondary | ICD-10-CM | POA: Diagnosis not present

## 2014-01-05 DIAGNOSIS — I10 Essential (primary) hypertension: Secondary | ICD-10-CM | POA: Diagnosis not present

## 2014-01-05 DIAGNOSIS — E291 Testicular hypofunction: Secondary | ICD-10-CM | POA: Diagnosis not present

## 2014-01-05 DIAGNOSIS — K219 Gastro-esophageal reflux disease without esophagitis: Secondary | ICD-10-CM | POA: Diagnosis not present

## 2014-01-05 DIAGNOSIS — N5201 Erectile dysfunction due to arterial insufficiency: Secondary | ICD-10-CM | POA: Diagnosis not present

## 2014-01-05 DIAGNOSIS — I482 Chronic atrial fibrillation: Secondary | ICD-10-CM | POA: Diagnosis not present

## 2014-02-06 ENCOUNTER — Encounter: Payer: Medicare Other | Admitting: *Deleted

## 2014-02-06 ENCOUNTER — Telehealth: Payer: Self-pay | Admitting: Cardiology

## 2014-02-06 NOTE — Telephone Encounter (Signed)
Confirmed remote transmission w/ pt wife.   

## 2014-02-06 NOTE — Telephone Encounter (Signed)
Spoke with after trying to trouble shoot monitor I come to conclusion that pt needs wirex. Wriex ordered. Pt aware.

## 2014-02-07 DIAGNOSIS — Z7901 Long term (current) use of anticoagulants: Secondary | ICD-10-CM | POA: Diagnosis not present

## 2014-02-07 DIAGNOSIS — I4891 Unspecified atrial fibrillation: Secondary | ICD-10-CM | POA: Diagnosis not present

## 2014-02-28 DIAGNOSIS — I4891 Unspecified atrial fibrillation: Secondary | ICD-10-CM | POA: Diagnosis not present

## 2014-02-28 DIAGNOSIS — Z7901 Long term (current) use of anticoagulants: Secondary | ICD-10-CM | POA: Diagnosis not present

## 2014-03-06 DIAGNOSIS — H40013 Open angle with borderline findings, low risk, bilateral: Secondary | ICD-10-CM | POA: Diagnosis not present

## 2014-03-17 ENCOUNTER — Other Ambulatory Visit: Payer: Self-pay | Admitting: Interventional Cardiology

## 2014-04-03 DIAGNOSIS — H40013 Open angle with borderline findings, low risk, bilateral: Secondary | ICD-10-CM | POA: Diagnosis not present

## 2014-04-29 ENCOUNTER — Other Ambulatory Visit: Payer: Self-pay | Admitting: Interventional Cardiology

## 2014-05-01 ENCOUNTER — Other Ambulatory Visit: Payer: Self-pay | Admitting: Interventional Cardiology

## 2014-06-29 DIAGNOSIS — I10 Essential (primary) hypertension: Secondary | ICD-10-CM | POA: Diagnosis not present

## 2014-06-29 DIAGNOSIS — Z7901 Long term (current) use of anticoagulants: Secondary | ICD-10-CM | POA: Diagnosis not present

## 2014-06-29 DIAGNOSIS — Z0000 Encounter for general adult medical examination without abnormal findings: Secondary | ICD-10-CM | POA: Diagnosis not present

## 2014-06-29 DIAGNOSIS — Z1389 Encounter for screening for other disorder: Secondary | ICD-10-CM | POA: Diagnosis not present

## 2014-06-29 DIAGNOSIS — E780 Pure hypercholesterolemia: Secondary | ICD-10-CM | POA: Diagnosis not present

## 2014-07-04 DIAGNOSIS — H40013 Open angle with borderline findings, low risk, bilateral: Secondary | ICD-10-CM | POA: Diagnosis not present

## 2014-07-28 DIAGNOSIS — I4891 Unspecified atrial fibrillation: Secondary | ICD-10-CM | POA: Diagnosis not present

## 2014-07-28 DIAGNOSIS — Z7901 Long term (current) use of anticoagulants: Secondary | ICD-10-CM | POA: Diagnosis not present

## 2014-08-25 DIAGNOSIS — R413 Other amnesia: Secondary | ICD-10-CM | POA: Diagnosis not present

## 2014-08-25 DIAGNOSIS — Z7901 Long term (current) use of anticoagulants: Secondary | ICD-10-CM | POA: Diagnosis not present

## 2014-08-25 DIAGNOSIS — E291 Testicular hypofunction: Secondary | ICD-10-CM | POA: Diagnosis not present

## 2014-08-28 ENCOUNTER — Other Ambulatory Visit: Payer: Self-pay

## 2014-09-16 ENCOUNTER — Other Ambulatory Visit: Payer: Self-pay | Admitting: Interventional Cardiology

## 2014-10-02 ENCOUNTER — Other Ambulatory Visit: Payer: Self-pay | Admitting: Interventional Cardiology

## 2014-10-11 ENCOUNTER — Other Ambulatory Visit: Payer: Self-pay | Admitting: Interventional Cardiology

## 2014-10-26 DIAGNOSIS — L570 Actinic keratosis: Secondary | ICD-10-CM | POA: Diagnosis not present

## 2014-10-26 DIAGNOSIS — C4359 Malignant melanoma of other part of trunk: Secondary | ICD-10-CM | POA: Diagnosis not present

## 2014-10-26 DIAGNOSIS — D1801 Hemangioma of skin and subcutaneous tissue: Secondary | ICD-10-CM | POA: Diagnosis not present

## 2014-10-26 DIAGNOSIS — L821 Other seborrheic keratosis: Secondary | ICD-10-CM | POA: Diagnosis not present

## 2014-10-26 DIAGNOSIS — L820 Inflamed seborrheic keratosis: Secondary | ICD-10-CM | POA: Diagnosis not present

## 2014-10-26 DIAGNOSIS — Z85828 Personal history of other malignant neoplasm of skin: Secondary | ICD-10-CM | POA: Diagnosis not present

## 2014-10-27 DIAGNOSIS — E291 Testicular hypofunction: Secondary | ICD-10-CM | POA: Diagnosis not present

## 2014-10-27 DIAGNOSIS — Z7901 Long term (current) use of anticoagulants: Secondary | ICD-10-CM | POA: Diagnosis not present

## 2014-11-09 DIAGNOSIS — C4359 Malignant melanoma of other part of trunk: Secondary | ICD-10-CM | POA: Diagnosis not present

## 2014-11-09 DIAGNOSIS — Z85828 Personal history of other malignant neoplasm of skin: Secondary | ICD-10-CM | POA: Diagnosis not present

## 2014-11-09 DIAGNOSIS — D235 Other benign neoplasm of skin of trunk: Secondary | ICD-10-CM | POA: Diagnosis not present

## 2014-11-09 DIAGNOSIS — Z85820 Personal history of malignant melanoma of skin: Secondary | ICD-10-CM | POA: Diagnosis not present

## 2014-11-15 ENCOUNTER — Other Ambulatory Visit: Payer: Self-pay | Admitting: Interventional Cardiology

## 2014-12-01 ENCOUNTER — Other Ambulatory Visit: Payer: Self-pay | Admitting: Interventional Cardiology

## 2014-12-06 ENCOUNTER — Encounter: Payer: Self-pay | Admitting: Interventional Cardiology

## 2014-12-06 ENCOUNTER — Ambulatory Visit (INDEPENDENT_AMBULATORY_CARE_PROVIDER_SITE_OTHER): Payer: Medicare Other | Admitting: Interventional Cardiology

## 2014-12-06 VITALS — BP 106/62 | HR 82 | Ht 71.0 in | Wt 212.8 lb

## 2014-12-06 DIAGNOSIS — I25709 Atherosclerosis of coronary artery bypass graft(s), unspecified, with unspecified angina pectoris: Secondary | ICD-10-CM | POA: Insufficient documentation

## 2014-12-06 DIAGNOSIS — I25810 Atherosclerosis of coronary artery bypass graft(s) without angina pectoris: Secondary | ICD-10-CM | POA: Diagnosis not present

## 2014-12-06 DIAGNOSIS — I482 Chronic atrial fibrillation, unspecified: Secondary | ICD-10-CM

## 2014-12-06 DIAGNOSIS — I10 Essential (primary) hypertension: Secondary | ICD-10-CM

## 2014-12-06 DIAGNOSIS — I25812 Atherosclerosis of bypass graft of coronary artery of transplanted heart without angina pectoris: Secondary | ICD-10-CM | POA: Diagnosis not present

## 2014-12-06 DIAGNOSIS — I495 Sick sinus syndrome: Secondary | ICD-10-CM | POA: Diagnosis not present

## 2014-12-06 DIAGNOSIS — Z950 Presence of cardiac pacemaker: Secondary | ICD-10-CM

## 2014-12-06 MED ORDER — DIGOXIN 125 MCG PO TABS: 125.0000 ug | ORAL_TABLET | Freq: Every day | ORAL | Status: DC

## 2014-12-06 NOTE — Patient Instructions (Signed)
Medication Instructions:  Your physician has recommended you make the following change in your medication:  REDUCE Digoxin to 121mcg daily. An Rx has been sent to your pharmacy   Labwork: None ordered  Testing/Procedures: None ordered  Follow-Up: Your physician wants you to follow-up in: 1 year with Dr.Smith You will receive a reminder letter in the mail two months in advance. If you don't receive a letter, please call our office to schedule the follow-up appointment.   Any Other Special Instructions Will Be Listed Below (If Applicable).

## 2014-12-06 NOTE — Progress Notes (Signed)
Patient ID: Timothy Harper, male   DOB: 01/15/1933, 81 y.o.   MRN: [id number]     Cardiology Office Note   Date:  12/06/2014   ID:  MARVELOUS BOUWENS, DOB Mar 07, 1932, MRN [id number]  PCP:  Irven Shelling, MD  Cardiologist:  Sinclair Grooms, MD   Chief Complaint  Patient presents with  . Atrial Fibrillation      History of Present Illness: Timothy Harper is a 81 y.o. male who presents for coronary artery disease with prior coronary bypass grafting and native vessel PCI, tachybradycardia syndrome with atrial fibrillation, hypertension, embolic CVA, and chronic anticoagulation therapy.  [name] is doing well. He denies cardiac complaints. There have been no episodes of bleeding. He denies head trauma. This no peripheral edema. He denies angina. No transient neurological complaints.  Past Medical History  Diagnosis Date  . Coronary artery disease involving coronary bypass graft 1994    LIMA to RCA . Prior PCI on RCA with restenosis  . Anomalous origin of coronary artery 2000    Stent LAD   . Atrial fibrillation   . HTN (hypertension)   . Hypothyroidism   . Hyperlipemia   . CVA (cerebral infarction) 1995  . GERD (gastroesophageal reflux disease)   . BPH (benign prostatic hyperplasia)   . Erectile dysfunction   . Cardiac pacemaker in situ   . Long term (current) use of anticoagulants   . Primary male hypogonadism   . CVA (cerebral infarction)     Right  . Hearing loss     hearing aids  . Melanoma     right ear, Rosana Harper 2002 and left arm 1997, [place]  . Herpes zoster   . Chronic atrial fibrillation     No past surgical history on file.   Current Outpatient Prescriptions  Medication Sig Dispense Refill  . DIGOX 250 MCG tablet TAKE 1 TABLET DAILY. 30 tablet 10  . diltiazem (TIAZAC) 180 MG 24 hr capsule Take 180 mg by mouth daily.    . furosemide (LASIX) 20 MG tablet Take 1 tablet (20 mg total) by mouth daily. 30 tablet 0  . levothyroxine (SYNTHROID,  LEVOTHROID) 112 MCG tablet Take 112 mcg by mouth daily before breakfast.    . Magnesium Oxide (MAG-200) 200 MG TABS Take 200 mg by mouth.    . metoprolol succinate (TOPROL-XL) 50 MG 24 hr tablet TAKE 1 TABLET TWICE DAILY. 60 tablet 2  . omeprazole (PRILOSEC) 20 MG capsule Take 20 mg by mouth as needed.    . potassium chloride SA (K-DUR,KLOR-CON) 20 MEQ tablet TAKE 1 TABLET ONCE DAILY. 30 tablet 1  . pravastatin (PRAVACHOL) 40 MG tablet Take 40 mg by mouth daily.    . sildenafil (VIAGRA) 100 MG tablet Take 100 mg by mouth daily as needed for erectile dysfunction.    . tamsulosin (FLOMAX) 0.4 MG CAPS capsule Take 0.4 mg by mouth daily.    . Testosterone (FORTESTA) 10 MG/ACT (2%) GEL Place onto the skin. 4 pumps daily    . vardenafil (LEVITRA) 20 MG tablet Take 20 mg by mouth daily as needed for erectile dysfunction.    Marland Kitchen warfarin (COUMADIN) 5 MG tablet Take 5 mg by mouth as needed.     No current facility-administered medications for this visit.    Allergies:   Penicillins and Prednisone    Social History:  The patient  reports that he quit smoking about 26 years ago. He does not have any smokeless tobacco history on file. He  reports that he drinks alcohol. He reports that he does not use illicit drugs.   Family History:  The patient's family history includes Heart disease in his mother.    ROS:  Please see the history of present illness.   Otherwise, review of systems are positive for decreased hearing and balance difficulty..   All other systems are reviewed and negative.    PHYSICAL EXAM: VS:  BP 106/62 mmHg  Pulse 82  Ht 5\' 11"  (1.803 m)  Wt 96.525 kg (212 lb 12.8 oz)  BMI 29.69 kg/m2 , BMI Body mass index is 29.69 kg/(m^2). GEN: Well nourished, well developed, in no acute distress HEENT: normal Neck: no JVD, carotid bruits, or masses Cardiac: IIRR.  There is no murmur, rub, or gallop. There is no edema. Respiratory:  clear to auscultation bilaterally, normal work of  breathing. GI: soft, nontender, nondistended, + BS MS: no deformity or atrophy Skin: warm and dry, no rash Neuro:  Strength and sensation are intact Psych: euthymic mood, full affect   EKG:  EKG is ordered today. The ekg reveals atrial fibrillation with good rate control ST-T wave abnormality noted on non-paced beats. RSR prime V1.   Recent Labs: No results found for requested labs within last 365 days.    Lipid Panel No results found for: CHOL, TRIG, HDL, CHOLHDL, VLDL, LDLCALC, LDLDIRECT    Wt Readings from Last 3 Encounters:  12/06/14 96.525 kg (212 lb 12.8 oz)  11/02/13 98.431 kg (217 lb)  08/04/13 97.523 kg (215 lb)      Other studies Reviewed: Additional studies/ records that were reviewed today include: no abnormality. .    ASSESSMENT AND PLAN:  1. Chronic atrial fibrillation (HCC) Controlled rate. He is on high-dose digoxin which I will decrease now that he is 81 years of age.  2. Coronary artery disease involving coronary bypass graft of native heart without angina pectoris Asymptomatic  3. Sinus node dysfunction (HCC) Treated with medications to slow rate and permanent pacemaker  4. Essential hypertension Excellent control  5. Cardiac pacemaker in situ Normal function    Current medicines are reviewed at length with the patient today.  The patient has the following concerns regarding medicines: We discussed digoxin and decreased clearance with aging. .  The following changes/actions have been instituted:    Decrease digoxin to 125 g daily  Labs/ tests ordered today include:  No orders of the defined types were placed in this encounter.     Disposition:   FU with HS in 1 year  Signed, Sinclair Grooms, MD  12/06/2014 8:50 AM    [facility] [address], [place], [place]  [address] Phone: [phone]; Fax: [phone]

## 2014-12-14 ENCOUNTER — Other Ambulatory Visit: Payer: Self-pay | Admitting: Interventional Cardiology

## 2014-12-30 ENCOUNTER — Other Ambulatory Visit: Payer: Self-pay | Admitting: Interventional Cardiology

## 2015-01-04 DIAGNOSIS — Z23 Encounter for immunization: Secondary | ICD-10-CM | POA: Diagnosis not present

## 2015-01-04 DIAGNOSIS — I519 Heart disease, unspecified: Secondary | ICD-10-CM | POA: Diagnosis not present

## 2015-01-04 DIAGNOSIS — K219 Gastro-esophageal reflux disease without esophagitis: Secondary | ICD-10-CM | POA: Diagnosis not present

## 2015-01-04 DIAGNOSIS — Z7901 Long term (current) use of anticoagulants: Secondary | ICD-10-CM | POA: Diagnosis not present

## 2015-01-04 DIAGNOSIS — N5201 Erectile dysfunction due to arterial insufficiency: Secondary | ICD-10-CM | POA: Diagnosis not present

## 2015-01-04 DIAGNOSIS — I482 Chronic atrial fibrillation: Secondary | ICD-10-CM | POA: Diagnosis not present

## 2015-01-04 DIAGNOSIS — I10 Essential (primary) hypertension: Secondary | ICD-10-CM | POA: Diagnosis not present

## 2015-01-04 DIAGNOSIS — N400 Enlarged prostate without lower urinary tract symptoms: Secondary | ICD-10-CM | POA: Diagnosis not present

## 2015-01-05 DIAGNOSIS — H40013 Open angle with borderline findings, low risk, bilateral: Secondary | ICD-10-CM | POA: Diagnosis not present

## 2015-01-16 ENCOUNTER — Encounter: Payer: Self-pay | Admitting: *Deleted

## 2015-01-16 DIAGNOSIS — H0289 Other specified disorders of eyelid: Secondary | ICD-10-CM | POA: Diagnosis not present

## 2015-01-19 ENCOUNTER — Encounter: Payer: Medicare Other | Admitting: Internal Medicine

## 2015-02-21 ENCOUNTER — Ambulatory Visit (INDEPENDENT_AMBULATORY_CARE_PROVIDER_SITE_OTHER): Payer: Medicare Other | Admitting: Internal Medicine

## 2015-02-21 ENCOUNTER — Encounter: Payer: Self-pay | Admitting: Internal Medicine

## 2015-02-21 VITALS — BP 96/74 | HR 68 | Ht 71.0 in | Wt 220.8 lb

## 2015-02-21 DIAGNOSIS — Z950 Presence of cardiac pacemaker: Secondary | ICD-10-CM

## 2015-02-21 DIAGNOSIS — I482 Chronic atrial fibrillation, unspecified: Secondary | ICD-10-CM

## 2015-02-21 DIAGNOSIS — I25812 Atherosclerosis of bypass graft of coronary artery of transplanted heart without angina pectoris: Secondary | ICD-10-CM

## 2015-02-21 DIAGNOSIS — I495 Sick sinus syndrome: Secondary | ICD-10-CM

## 2015-02-21 LAB — CUP PACEART INCLINIC DEVICE CHECK
Battery Remaining Longevity: 91 mo
Brady Statistic RV Percent Paced: 45 %
Date Time Interrogation Session: 20161221110546
Implantable Lead Implant Date: 20090506
Implantable Lead Implant Date: 20090506
Implantable Lead Location: 753860
Lead Channel Pacing Threshold Amplitude: 0.75 V
Lead Channel Pacing Threshold Amplitude: 0.75 V
Lead Channel Pacing Threshold Pulse Width: 0.4 ms
Lead Channel Setting Pacing Pulse Width: 0.4 ms
Lead Channel Setting Sensing Sensitivity: 4 mV
MDC IDC LEAD LOCATION: 753859
MDC IDC MSMT BATTERY IMPEDANCE: 612 Ohm
MDC IDC MSMT BATTERY VOLTAGE: 2.78 V
MDC IDC MSMT LEADCHNL RA IMPEDANCE VALUE: 67 Ohm
MDC IDC MSMT LEADCHNL RV IMPEDANCE VALUE: 582 Ohm
MDC IDC MSMT LEADCHNL RV PACING THRESHOLD PULSEWIDTH: 0.4 ms
MDC IDC MSMT LEADCHNL RV SENSING INTR AMPL: 8 mV
MDC IDC SET LEADCHNL RV PACING AMPLITUDE: 2.5 V

## 2015-02-21 NOTE — Patient Instructions (Signed)
Medication Instructions:  Your physician recommends that you continue on your current medications as directed. Please refer to the Current Medication list given to you today.   Labwork: None ordered   Testing/Procedures: None ordered   Follow-Up: Your physician wants you to follow-up in: 12 months with Dr Knox Saliva will receive a reminder letter in the mail two months in advance. If you don't receive a letter, please call our office to schedule the follow-up appointment.  Remote monitoring is used to monitor your Pacemaker  from home. This monitoring reduces the number of office visits required to check your device to one time per year. It allows [place] to keep an eye on the functioning of your device to ensure it is working properly. You are scheduled for a device check from home on 05/23/15. You may send your transmission at any time that day. If you have a wireless device, the transmission will be sent automatically. After your physician reviews your transmission, you will receive a postcard with your next transmission date.      Any Other Special Instructions Will Be Listed Below (If Applicable).     If you need a refill on your cardiac medications before your next appointment, please call your pharmacy.

## 2015-02-21 NOTE — Assessment & Plan Note (Signed)
He remains in atrial fib. His ventricular rate is well controlled. Will continue his current meds including coumadin.

## 2015-02-21 NOTE — Progress Notes (Signed)
HPI Mr. Noe is referred today for ongoing evaluation and management of his PPM. He has a h/o sinus node dysfunction and now atrial fibrillation. He underwent his initial PPM insertion in 1994 with subsequent change outs. He has chronic atrial fibrillation. He has rate control. He has mild LV dysfunction by echo. He is fairly sedentary but does not have peripheral edema or chest pain. Minimal if any dyspnea on exertion. He has been playing golf. Allergies  Allergen Reactions  . Penicillins     Swollen face/lips  . Prednisone     swelling     Current Outpatient Prescriptions  Medication Sig Dispense Refill  . atorvastatin (LIPITOR) 20 MG tablet Take 20 mg by mouth daily.    . digoxin (LANOXIN) 0.125 MG tablet Take 1 tablet (125 mcg total) by mouth daily. 30 tablet 11  . diltiazem (TIAZAC) 180 MG 24 hr capsule Take 180 mg by mouth daily.    . furosemide (LASIX) 20 MG tablet Take 1 tablet (20 mg total) by mouth daily. 30 tablet 11  . levothyroxine (SYNTHROID, LEVOTHROID) 112 MCG tablet Take 112 mcg by mouth daily before breakfast.    . Magnesium Oxide (MAG-200) 200 MG TABS Take 200 mg by mouth.    . metoprolol succinate (TOPROL-XL) 50 MG 24 hr tablet TAKE 1 TABLET TWICE DAILY. 60 tablet 2  . omeprazole (PRILOSEC) 20 MG capsule Take 20 mg by mouth as needed.    . potassium chloride SA (K-DUR,KLOR-CON) 20 MEQ tablet TAKE 1 TABLET ONCE DAILY. 30 tablet 6  . pravastatin (PRAVACHOL) 40 MG tablet Take 40 mg by mouth daily.    . sildenafil (VIAGRA) 100 MG tablet Take 100 mg by mouth daily as needed for erectile dysfunction.    . tamsulosin (FLOMAX) 0.4 MG CAPS capsule Take 0.4 mg by mouth daily.    . Testosterone (FORTESTA) 10 MG/ACT (2%) GEL Place onto the skin. 4 pumps daily    . vardenafil (LEVITRA) 20 MG tablet Take 20 mg by mouth daily as needed for erectile dysfunction.    Marland Kitchen warfarin (COUMADIN) 5 MG tablet Take 5 mg by mouth as needed.     No current facility-administered  medications for this visit.     Past Medical History  Diagnosis Date  . Coronary artery disease involving coronary bypass graft 1994    LIMA to RCA . Prior PCI on RCA with restenosis  . Anomalous origin of coronary artery 2000    Stent LAD   . Atrial fibrillation ([place])   . HTN (hypertension)   . Hypothyroidism   . Hyperlipemia   . CVA (cerebral infarction) 1995  . GERD (gastroesophageal reflux disease)   . BPH (benign prostatic hyperplasia)   . Erectile dysfunction   . Cardiac pacemaker in situ   . Long term (current) use of anticoagulants   . Primary male hypogonadism   . CVA (cerebral infarction)     Right  . Hearing loss     hearing aids  . Melanoma [facility])     right ear, Rosana Hoes 2002 and left arm 1997, [place]  . Herpes zoster   . Chronic atrial fibrillation (HCC)     ROS:   All systems reviewed and negative except as noted in the HPI.   No past surgical history on file.   Family History  Problem Relation Age of Onset  . Heart disease Mother      Social History   Social History  . Marital Status:  Married    Spouse Name: N/A  . Number of Children: 4  . Years of Education: N/A   Occupational History  . financial advisor     retired   Social History Main Topics  . Smoking status: Former Smoker    Quit date: 08/01/1988  . Smokeless tobacco: Not on file  . Alcohol Use: Yes     Comment: 2-3 times a week  . Drug Use: No  . Sexual Activity: Not on file   Other Topics Concern  . Not on file   Social History Narrative     BP 96/74 mmHg  Pulse 68  Ht 5\' 11"  (1.803 m)  Wt 220 lb 12.8 oz (100.154 kg)  BMI 30.81 kg/m2  SpO2 93%  Physical Exam:  Well appearing 81 yo man, NAD HEENT: Unremarkable Neck:  6 cm JVD, no thyromegally Back:  No CVA tenderness Lungs:  Clear with no wheezes HEART:  IRegular rate rhythm, no murmurs, no rubs, no clicks Abd:  soft, positive bowel sounds, no organomegally, no rebound, no guarding Ext:  2 plus pulses,  no edema, no cyanosis, no clubbing Skin:  No rashes no nodules Neuro:  CN II through XII intact, motor grossly intact  EKG - atrial fib with a controlled VR  DEVICE  Normal device function.  See PaceArt for details.   Assess/Plan:

## 2015-02-21 NOTE — Assessment & Plan Note (Signed)
His medtronic device is working normally. Will recheck in several months.  

## 2015-02-22 ENCOUNTER — Telehealth: Payer: Self-pay | Admitting: Internal Medicine

## 2015-02-22 NOTE — Telephone Encounter (Signed)
New message  Pt called req a call back to discuss how to send a signal on the remote. Please e-mail to instructions to   E-MAIL: [email or web address]

## 2015-02-22 NOTE — Telephone Encounter (Signed)
Informed pt that we received his remote transmission and that if he still needed me to e-mail instructions to him to call me back.

## 2015-03-27 ENCOUNTER — Other Ambulatory Visit: Payer: Self-pay | Admitting: Interventional Cardiology

## 2015-05-23 ENCOUNTER — Ambulatory Visit (INDEPENDENT_AMBULATORY_CARE_PROVIDER_SITE_OTHER): Payer: Medicare Other | Admitting: *Deleted

## 2015-05-23 ENCOUNTER — Telehealth: Payer: Self-pay | Admitting: Cardiology

## 2015-05-23 DIAGNOSIS — Z950 Presence of cardiac pacemaker: Secondary | ICD-10-CM

## 2015-05-23 DIAGNOSIS — I495 Sick sinus syndrome: Secondary | ICD-10-CM | POA: Diagnosis not present

## 2015-05-23 NOTE — Progress Notes (Signed)
Remote pacemaker transmission.   

## 2015-05-23 NOTE — Telephone Encounter (Signed)
Spoke with pt and reminded pt of remote transmission that is due today. Pt verbalized understanding.   

## 2015-06-21 LAB — CUP PACEART REMOTE DEVICE CHECK
Battery Remaining Longevity: 87 mo
Brady Statistic RV Percent Paced: 33 %
Implantable Lead Implant Date: 20090506
Implantable Lead Location: 753860
Implantable Lead Model: 4024
Lead Channel Impedance Value: 552 Ohm
Lead Channel Impedance Value: 67 Ohm
Lead Channel Sensing Intrinsic Amplitude: 8 mV
Lead Channel Setting Pacing Amplitude: 2.5 V
MDC IDC LEAD IMPLANT DT: 20090506
MDC IDC LEAD LOCATION: 753859
MDC IDC MSMT BATTERY IMPEDANCE: 689 Ohm
MDC IDC MSMT BATTERY VOLTAGE: 2.77 V
MDC IDC MSMT LEADCHNL RV PACING THRESHOLD AMPLITUDE: 0.75 V
MDC IDC MSMT LEADCHNL RV PACING THRESHOLD PULSEWIDTH: 0.4 ms
MDC IDC SESS DTM: 20170322144834
MDC IDC SET LEADCHNL RV PACING PULSEWIDTH: 0.4 ms
MDC IDC SET LEADCHNL RV SENSING SENSITIVITY: 4 mV

## 2015-06-22 ENCOUNTER — Encounter: Payer: Self-pay | Admitting: Cardiology

## 2015-07-06 ENCOUNTER — Encounter: Payer: Self-pay | Admitting: Cardiology

## 2015-08-10 ENCOUNTER — Other Ambulatory Visit: Payer: Self-pay | Admitting: Interventional Cardiology

## 2015-08-22 ENCOUNTER — Ambulatory Visit (INDEPENDENT_AMBULATORY_CARE_PROVIDER_SITE_OTHER): Payer: Medicare Other | Admitting: *Deleted

## 2015-08-22 ENCOUNTER — Telehealth: Payer: Self-pay | Admitting: Cardiology

## 2015-08-22 DIAGNOSIS — I495 Sick sinus syndrome: Secondary | ICD-10-CM

## 2015-08-22 NOTE — Progress Notes (Signed)
Remote pacemaker transmission.   

## 2015-08-22 NOTE — Telephone Encounter (Signed)
Spoke with pt and reminded pt of remote transmission that is due today. Pt verbalized understanding.   

## 2015-08-24 ENCOUNTER — Encounter: Payer: Self-pay | Admitting: Cardiology

## 2015-08-25 LAB — CUP PACEART REMOTE DEVICE CHECK
Battery Impedance: 742 Ohm
Battery Voltage: 2.77 V
Implantable Lead Implant Date: 20090506
Implantable Lead Implant Date: 20090506
Implantable Lead Model: 4024
Lead Channel Impedance Value: 67 Ohm
Lead Channel Pacing Threshold Pulse Width: 0.4 ms
Lead Channel Setting Pacing Pulse Width: 0.4 ms
Lead Channel Setting Sensing Sensitivity: 4 mV
MDC IDC LEAD LOCATION: 753859
MDC IDC LEAD LOCATION: 753860
MDC IDC MSMT BATTERY REMAINING LONGEVITY: 85 mo
MDC IDC MSMT LEADCHNL RV IMPEDANCE VALUE: 566 Ohm
MDC IDC MSMT LEADCHNL RV PACING THRESHOLD AMPLITUDE: 0.875 V
MDC IDC MSMT LEADCHNL RV SENSING INTR AMPL: 8 mV
MDC IDC SESS DTM: 20170621160929
MDC IDC SET LEADCHNL RV PACING AMPLITUDE: 2.5 V
MDC IDC STAT BRADY RV PERCENT PACED: 32 %

## 2015-09-07 ENCOUNTER — Encounter: Payer: Self-pay | Admitting: Cardiology

## 2015-11-21 ENCOUNTER — Telehealth: Payer: Self-pay | Admitting: Cardiology

## 2015-11-21 ENCOUNTER — Encounter: Payer: Medicare Other | Admitting: *Deleted

## 2015-11-21 NOTE — Telephone Encounter (Signed)
Confirmed remote transmission w/ pt wife.   

## 2015-11-23 ENCOUNTER — Encounter: Payer: Self-pay | Admitting: Interventional Cardiology

## 2015-11-23 ENCOUNTER — Encounter: Payer: Self-pay | Admitting: Cardiology

## 2015-12-05 ENCOUNTER — Ambulatory Visit (INDEPENDENT_AMBULATORY_CARE_PROVIDER_SITE_OTHER): Payer: Medicare Other | Admitting: *Deleted

## 2015-12-05 DIAGNOSIS — I495 Sick sinus syndrome: Secondary | ICD-10-CM

## 2015-12-06 NOTE — Progress Notes (Signed)
Remote pacemaker transmission.   

## 2015-12-07 ENCOUNTER — Encounter: Payer: Self-pay | Admitting: Cardiology

## 2015-12-08 ENCOUNTER — Other Ambulatory Visit: Payer: Self-pay | Admitting: Interventional Cardiology

## 2015-12-12 ENCOUNTER — Encounter: Payer: Self-pay | Admitting: Interventional Cardiology

## 2015-12-12 ENCOUNTER — Ambulatory Visit (INDEPENDENT_AMBULATORY_CARE_PROVIDER_SITE_OTHER): Payer: Medicare Other | Admitting: Interventional Cardiology

## 2015-12-12 VITALS — BP 108/72 | HR 88 | Ht 71.0 in | Wt 224.2 lb

## 2015-12-12 DIAGNOSIS — I495 Sick sinus syndrome: Secondary | ICD-10-CM

## 2015-12-12 DIAGNOSIS — I482 Chronic atrial fibrillation, unspecified: Secondary | ICD-10-CM

## 2015-12-12 DIAGNOSIS — Z950 Presence of cardiac pacemaker: Secondary | ICD-10-CM | POA: Diagnosis not present

## 2015-12-12 DIAGNOSIS — I25768 Atherosclerosis of bypass graft of coronary artery of transplanted heart with other forms of angina pectoris: Secondary | ICD-10-CM

## 2015-12-12 NOTE — Progress Notes (Signed)
Cardiology Office Note    Date:  12/12/2015   ID:  Timothy Harper, DOB Jul 11, 1932, MRN [id number]  PCP:  Irven Shelling, MD  Cardiologist: Sinclair Grooms, MD   Chief Complaint  Patient presents with  . Atrial Fibrillation    History of Present Illness:  Timothy Harper is a 82 y.o. male who presents for coronary artery disease with prior coronary bypass grafting and native vessel PCI, tachybradycardia syndrome with atrial fibrillation, hypertension, embolic CVA, and chronic anticoagulation therapy.   Having some difficulty with balance. No chest pain. No episodes of syncope or tachycardia. Shelva Majestic has decreased Lanoxin to 3 times per week. I agree with that adjustment. He has not had angina. No blood in the urine or stool. No transient neurological complaints.  Past Medical History:  Diagnosis Date  . 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 Atrial fibrillation ([place])   . BPH (benign prostatic hyperplasia)   . Cardiac pacemaker in situ   . Chronic atrial fibrillation ([place])   . Coronary artery disease involving coronary bypass graft 1994   LIMA to RCA . Prior PCI on RCA with restenosis  . CVA (cerebral infarction) 1995  . CVA (cerebral infarction)    Right  . Erectile dysfunction   . GERD (gastroesophageal reflux disease)   . Hearing loss    hearing aids  . Herpes zoster   . HTN (hypertension)   . Hyperlipemia   . Hypothyroidism   . Long term (current) use of anticoagulants   . Melanoma [facility])    right ear, Rosana Hoes 2002 and left arm 1997, [place]  . Primary male hypogonadism     No past surgical history on file.  Current Medications: Outpatient Medications Prior to Visit  Medication Sig Dispense Refill  . atorvastatin (LIPITOR) 20 MG tablet Take 20 mg by mouth daily.    Marland Kitchen diltiazem (TIAZAC) 180 MG 24 hr capsule Take 180 mg by mouth daily.    . furosemide (LASIX) 20 MG tablet Take 1 tablet (20 mg total) by mouth daily. 30  tablet 11  . levothyroxine (SYNTHROID, LEVOTHROID) 112 MCG tablet Take 112 mcg by mouth daily before breakfast.    . Magnesium Oxide (MAG-200) 200 MG TABS Take 200 mg by mouth.    . metoprolol succinate (TOPROL-XL) 50 MG 24 hr tablet Take 1 tablet (50 mg total) by mouth 2 (two) times daily. 60 tablet 8  . omeprazole (PRILOSEC) 20 MG capsule Take 20 mg by mouth as needed.    . potassium chloride SA (K-DUR,KLOR-CON) 20 MEQ tablet TAKE 1 TABLET ONCE DAILY. 30 tablet 5  . pravastatin (PRAVACHOL) 40 MG tablet Take 40 mg by mouth daily.    . sildenafil (VIAGRA) 100 MG tablet Take 100 mg by mouth daily as needed for erectile dysfunction.    . tamsulosin (FLOMAX) 0.4 MG CAPS capsule Take 0.4 mg by mouth daily.    . Testosterone (FORTESTA) 10 MG/ACT (2%) GEL Place onto the skin. 4 pumps daily    . vardenafil (LEVITRA) 20 MG tablet Take 20 mg by mouth daily as needed for erectile dysfunction.    Marland Kitchen warfarin (COUMADIN) 5 MG tablet Take 5 mg by mouth as needed.    . digoxin (DIGOX) 0.125 MG tablet Take 1 tablet (125 mcg total) by mouth daily. (Patient not taking: Reported on 12/12/2015) 30 tablet 0   No facility-administered medications prior to visit.      Allergies:  Penicillins and Prednisone   Social History   Social History  . Marital status: Married    Spouse name: N/A  . Number of children: 4  . Years of education: N/A   Occupational History  . financial advisor     retired   Social History Main Topics  . Smoking status: Former Smoker    Quit date: 08/01/1988  . Smokeless tobacco: Never Used  . Alcohol use Yes     Comment: 2-3 times a week  . Drug use: No  . Sexual activity: Not Asked   Other Topics Concern  . None   Social History Narrative  . None     Family History:  The patient's family history includes Heart disease in his mother.   ROS:   Please see the history of present illness.    Occasional dizziness and difficulty with balance. Cramping at night.  All other  systems reviewed and are negative.   PHYSICAL EXAM:   VS:  BP 108/72   Pulse 88   Ht 5\' 11"  (1.803 m)   Wt 224 lb 3.2 oz (101.7 kg)   BMI 31.27 kg/m    GEN: Well nourished, well developed, in no acute distress  HEENT: normal  Neck: no JVD, carotid bruits, or masses Cardiac: IIRR; no murmurs, rubs, or gallops,no edema  Respiratory:  clear to auscultation bilaterally, normal work of breathing GI: soft, nontender, nondistended, + BS MS: no deformity or atrophy  Skin: warm and dry, no rash Neuro:  Alert and Oriented x 3, Strength and sensation are intact Psych: euthymic mood, full affect  Wt Readings from Last 3 Encounters:  12/12/15 224 lb 3.2 oz (101.7 kg)  02/21/15 220 lb 12.8 oz (100.2 kg)  12/06/14 212 lb 12.8 oz (96.5 kg)      Studies/Labs Reviewed:   EKG:  EKG  Atrial fibrillation, ventricular response 88 bpm. Occasional pacing, nonspecific ST-T wave abnormality diffuse.  Recent Labs: No results found for requested labs within last 8760 hours.   Lipid Panel No results found for: CHOL, TRIG, HDL, CHOLHDL, VLDL, LDLCALC, LDLDIRECT  Additional studies/ records that were reviewed today include:  No new data    ASSESSMENT:    1. 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 Chronic atrial fibrillation (HCC)   3. Sinus node dysfunction (HCC)   4. Pacemaker      PLAN:  In order of problems listed above:  1. No angina. He is to notify us of chest discomfort. We recommended aerobic activity. 2. Continue Coumadin for stroke prevention. Chads score is 3. 3. Permanent pacemaker functioning normally as management of his sinus node dysfunction/tachybradycardia syndrome.    Medication Adjustments/Labs and Tests Ordered: Current medicines are reviewed at length with the patient today.  Concerns regarding medicines are outlined above.  Medication changes, Labs and Tests ordered today are listed in the Patient  Instructions below. There are no Patient Instructions on file for this visit.   Signed, Sinclair Grooms, MD  12/12/2015 9:25 AM    [facility] [address], [place], [place]  [address] Phone: [phone]; Fax: [phone]

## 2015-12-12 NOTE — Patient Instructions (Signed)
Medication Instructions:  None  Labwork: None  Testing/Procedures: None  Follow-Up: Your physician wants you to follow-up in: 1 year with Dr. Smith. You will receive a reminder letter in the mail two months in advance. If you don't receive a letter, please call our office to schedule the follow-up appointment.   Any Other Special Instructions Will Be Listed Below (If Applicable).  Your physician discussed the importance of regular exercise and recommended that you start or continue a regular exercise program for good health.    If you need a refill on your cardiac medications before your next appointment, please call your pharmacy.   

## 2015-12-14 LAB — CUP PACEART REMOTE DEVICE CHECK
Battery Impedance: 768 Ohm
Battery Remaining Longevity: 84 mo
Battery Voltage: 2.77 V
Date Time Interrogation Session: 20171004141316
Implantable Lead Location: 753859
Implantable Lead Model: 4024
Lead Channel Pacing Threshold Pulse Width: 0.4 ms
Lead Channel Setting Sensing Sensitivity: 4 mV
MDC IDC LEAD IMPLANT DT: 20090506
MDC IDC LEAD IMPLANT DT: 20090506
MDC IDC LEAD LOCATION: 753860
MDC IDC MSMT LEADCHNL RA IMPEDANCE VALUE: 67 Ohm
MDC IDC MSMT LEADCHNL RV IMPEDANCE VALUE: 545 Ohm
MDC IDC MSMT LEADCHNL RV PACING THRESHOLD AMPLITUDE: 0.875 V
MDC IDC SET LEADCHNL RV PACING AMPLITUDE: 2.5 V
MDC IDC SET LEADCHNL RV PACING PULSEWIDTH: 0.4 ms
MDC IDC STAT BRADY RV PERCENT PACED: 27 %

## 2015-12-21 ENCOUNTER — Encounter: Payer: Self-pay | Admitting: Cardiology

## 2016-01-05 ENCOUNTER — Other Ambulatory Visit: Payer: Self-pay | Admitting: Interventional Cardiology

## 2016-01-17 ENCOUNTER — Other Ambulatory Visit: Payer: Self-pay | Admitting: Interventional Cardiology

## 2016-01-23 ENCOUNTER — Other Ambulatory Visit: Payer: Self-pay | Admitting: Interventional Cardiology

## 2016-02-06 ENCOUNTER — Encounter: Payer: Self-pay | Admitting: Internal Medicine

## 2016-02-06 ENCOUNTER — Ambulatory Visit (INDEPENDENT_AMBULATORY_CARE_PROVIDER_SITE_OTHER): Payer: Medicare Other | Admitting: Internal Medicine

## 2016-02-06 VITALS — BP 110/73 | HR 71 | Ht 71.0 in | Wt 223.0 lb

## 2016-02-06 DIAGNOSIS — I482 Chronic atrial fibrillation, unspecified: Secondary | ICD-10-CM

## 2016-02-06 DIAGNOSIS — Z950 Presence of cardiac pacemaker: Secondary | ICD-10-CM

## 2016-02-06 NOTE — Progress Notes (Signed)
HPI [name] is referred today for ongoing evaluation and management of his PPM. He has a h/o sinus node dysfunction and now atrial fibrillation. He underwent his initial PPM insertion in 1994 with subsequent change outs. He has chronic atrial fibrillation. He has rate control. He has mild LV dysfunction by echo. He is fairly sedentary but does not have peripheral edema or chest pain. Minimal if any dyspnea on exertion. He has been playing golf. He has not been in the hospital in the last year. Allergies  Allergen Reactions  . Penicillins     Swollen face/lips  . Prednisone     swelling     Current Outpatient Prescriptions  Medication Sig Dispense Refill  . atorvastatin (LIPITOR) 20 MG tablet Take 20 mg by mouth daily.    . DENTA 5000 PLUS 1.1 % CREA dental cream Apply 1 application topically 2 (two) times daily.    . digoxin (DIGOX) 0.125 MG tablet Take 0.125 mg by mouth 3 (three) times a week.     . diltiazem (TIAZAC) 180 MG 24 hr capsule Take 180 mg by mouth daily.    . furosemide (LASIX) 20 MG tablet Take 20 mg by mouth daily.    Marland Kitchen levothyroxine (SYNTHROID, LEVOTHROID) 112 MCG tablet Take 112 mcg by mouth daily before breakfast.    . Magnesium Oxide (MAG-200) 200 MG TABS Take 200 mg by mouth as directed.     . metoprolol succinate (TOPROL-XL) 50 MG 24 hr tablet Take 1 tablet (50 mg total) by mouth 2 (two) times daily. 60 tablet 11  . omeprazole (PRILOSEC) 20 MG capsule Take 20 mg by mouth as directed. Take as needed    . potassium chloride SA (K-DUR,KLOR-CON) 20 MEQ tablet Take 20 mEq by mouth daily.    . sildenafil (VIAGRA) 100 MG tablet Take 100 mg by mouth daily as needed for erectile dysfunction.    . tamsulosin (FLOMAX) 0.4 MG CAPS capsule Take 0.4 mg by mouth daily.    . Testosterone (FORTESTA) 10 MG/ACT (2%) GEL Place onto the skin. 4 pumps daily    . vardenafil (LEVITRA) 20 MG tablet Take 20 mg by mouth daily as needed for erectile dysfunction.    Marland Kitchen warfarin  (COUMADIN) 5 MG tablet Take 5 mg by mouth as needed.     No current facility-administered medications for this visit.      Past Medical History:  Diagnosis Date  . 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 Atrial fibrillation ([place])   . BPH (benign prostatic hyperplasia)   . Cardiac pacemaker in situ   . Chronic atrial fibrillation ([place])   . Coronary artery disease involving coronary bypass graft 1994   LIMA to RCA . Prior PCI on RCA with restenosis  . CVA (cerebral infarction) 1995  . CVA (cerebral infarction)    Right  . Erectile dysfunction   . GERD (gastroesophageal reflux disease)   . Hearing loss    hearing aids  . Herpes zoster   . HTN (hypertension)   . Hyperlipemia   . Hypothyroidism   . Long term (current) use of anticoagulants   . Melanoma [facility])    right ear, Rosana Hoes 2002 and left arm 1997, [place]  . Primary male hypogonadism     ROS:   All systems reviewed and negative except as noted in the HPI.   No past surgical history on file.   Family History  Problem Relation Age  of Onset  . Heart disease Mother      Social History   Social History  . Marital status: Married    Spouse name: N/A  . Number of children: 4  . Years of education: N/A   Occupational History  . financial advisor     retired   Social History Main Topics  . Smoking status: Former Smoker    Quit date: 08/01/1988  . Smokeless tobacco: Never Used  . Alcohol use Yes     Comment: 2-3 times a week  . Drug use: No  . Sexual activity: Not on file   Other Topics Concern  . Not on file   Social History Narrative  . No narrative on file     BP 110/73   Pulse 71   Ht 5\' 11"  (1.803 m)   Wt 223 lb (101.2 kg)   BMI 31.10 kg/m   Physical Exam:  Well appearing 82 yo man, NAD HEENT: Unremarkable Neck:  6 cm JVD, no thyromegally Back:  No CVA tenderness Lungs:  Clear with no wheezes HEART:  IRegular rate rhythm, no murmurs, no rubs, no clicks Abd:   soft, positive bowel sounds, no organomegally, no rebound, no guarding Ext:  2 plus pulses, no edema, no cyanosis, no clubbing Skin:  No rashes no nodules Neuro:  CN II through XII intact, motor grossly intact  EKG - atrial fib with a controlled VR  DEVICE  Normal device function.  See PaceArt for details.   Assess/Plan: 1. Atrial fib - he is doing well from this perspective. He has had some high rates but is asymptomatic.  2. HTN - his blood pressure is well controlled.  3. CAD - he denies anginal symptoms and will continue to remain active. He will call if he experiences anginal symptoms.  Mikle Bosworth.D.

## 2016-02-06 NOTE — Patient Instructions (Signed)
Medication Instructions:  Your physician recommends that you continue on your current medications as directed. Please refer to the Current Medication list given to you today.   Labwork: None Ordered   Testing/Procedures: None Ordered   Follow-Up: Your physician wants you to follow-up in: 1 year with Dr. Taylor.  You will receive a reminder letter in the mail two months in advance. If you don't receive a letter, please call our office to schedule the follow-up appointment.  Remote monitoring is used to monitor your Pacemaker from home. This monitoring reduces the number of office visits required to check your device to one time per year. It allows us to keep an eye on the functioning of your device to ensure it is working properly. You are scheduled for a device check from home on 05/07/16. You may send your transmission at any time that day. If you have a wireless device, the transmission will be sent automatically. After your physician reviews your transmission, you will receive a postcard with your next transmission date.    Any Other Special Instructions Will Be Listed Below (If Applicable).     If you need a refill on your cardiac medications before your next appointment, please call your pharmacy.   

## 2016-02-15 ENCOUNTER — Other Ambulatory Visit: Payer: Self-pay | Admitting: Internal Medicine

## 2016-02-29 LAB — CUP PACEART INCLINIC DEVICE CHECK
Battery Voltage: 2.77 V
Implantable Lead Implant Date: 20090506
Implantable Lead Location: 753860
Implantable Pulse Generator Implant Date: 20090506
Lead Channel Impedance Value: 559 Ohm
Lead Channel Impedance Value: 67 Ohm
Lead Channel Pacing Threshold Amplitude: 0.75 V
Lead Channel Pacing Threshold Pulse Width: 0.4 ms
Lead Channel Setting Sensing Sensitivity: 4 mV
MDC IDC LEAD IMPLANT DT: 20090506
MDC IDC LEAD LOCATION: 753859
MDC IDC MSMT BATTERY IMPEDANCE: 898 Ohm
MDC IDC MSMT BATTERY REMAINING LONGEVITY: 77 mo
MDC IDC MSMT LEADCHNL RV SENSING INTR AMPL: 8 mV
MDC IDC SESS DTM: 20171206153843
MDC IDC SET LEADCHNL RV PACING AMPLITUDE: 2.5 V
MDC IDC SET LEADCHNL RV PACING PULSEWIDTH: 0.4 ms
MDC IDC STAT BRADY RV PERCENT PACED: 26 %

## 2016-05-07 ENCOUNTER — Telehealth: Payer: Self-pay | Admitting: Cardiology

## 2016-05-07 ENCOUNTER — Encounter: Payer: Medicare Other | Admitting: *Deleted

## 2016-05-07 NOTE — Telephone Encounter (Signed)
Spoke with pt and reminded pt of remote transmission that is due today. Pt verbalized understanding.   

## 2016-05-09 ENCOUNTER — Encounter: Payer: Self-pay | Admitting: Cardiology

## 2016-05-13 ENCOUNTER — Encounter: Payer: Medicare Other | Admitting: *Deleted

## 2016-07-07 ENCOUNTER — Telehealth: Payer: Self-pay | Admitting: Interventional Cardiology

## 2016-07-07 NOTE — Telephone Encounter (Signed)
Patient calling, states that his "heart throbbing more than usual, loosing energy and endurance." Patient states that he is not in pain. Patient refused appt with PA and states that " Dr. Tamala Julian knows me and told me to call him anytime I was having trouble and he would see me." Please call to discuss.Thanks.

## 2016-07-07 NOTE — Telephone Encounter (Signed)
Pt states that recently he has noticed his "heart throbbing" more.  Pt states that this is intermittent and he will hear it pound for a little while and then stop.  Pt has also been having SOB with exertion recently.  Has to rest after climbing stairs or after two golf swings.  Recovers after 2-3 mins and is able to resume activity.  Also been having some unsteady gait and cramping in legs.  Denies dizziness, lightheadedness or CP.  Vitals last night were 118/70, 67.  States he did not exercise at all the first few months this year.  Concerned maybe in and out of Afib.  Pt only wanted to see Dr. Tamala Julian.  Scheduled pt for 07/09/16 at 1:40pm with Dr. Tamala Julian and advised I would send message to Dr. Tamala Julian for further review and advisement.

## 2016-07-09 ENCOUNTER — Ambulatory Visit (INDEPENDENT_AMBULATORY_CARE_PROVIDER_SITE_OTHER): Payer: Medicare Other | Admitting: Interventional Cardiology

## 2016-07-09 ENCOUNTER — Encounter: Payer: Self-pay | Admitting: Interventional Cardiology

## 2016-07-09 VITALS — BP 114/70 | HR 105 | Ht 71.0 in | Wt 227.2 lb

## 2016-07-09 DIAGNOSIS — R0602 Shortness of breath: Secondary | ICD-10-CM | POA: Diagnosis not present

## 2016-07-09 DIAGNOSIS — I482 Chronic atrial fibrillation, unspecified: Secondary | ICD-10-CM

## 2016-07-09 DIAGNOSIS — Z79899 Other long term (current) drug therapy: Secondary | ICD-10-CM

## 2016-07-09 DIAGNOSIS — I495 Sick sinus syndrome: Secondary | ICD-10-CM | POA: Diagnosis not present

## 2016-07-09 DIAGNOSIS — Z950 Presence of cardiac pacemaker: Secondary | ICD-10-CM

## 2016-07-09 DIAGNOSIS — I10 Essential (primary) hypertension: Secondary | ICD-10-CM | POA: Diagnosis not present

## 2016-07-09 DIAGNOSIS — I25709 Atherosclerosis of coronary artery bypass graft(s), unspecified, with unspecified angina pectoris: Secondary | ICD-10-CM

## 2016-07-09 MED ORDER — FUROSEMIDE 40 MG PO TABS: 40.0000 mg | ORAL_TABLET | Freq: Every day | ORAL | 3 refills | Status: DC

## 2016-07-09 NOTE — Addendum Note (Signed)
Addended by: Eulis Foster on: 07/09/2016 02:41 PM   Modules accepted: Orders

## 2016-07-09 NOTE — Progress Notes (Signed)
Cardiology Office Note    Date:  07/09/2016   ID:  Timothy Harper, DOB [birth year], MRN [id number]  PCP:  Lavone Orn, MD  Cardiologist: Sinclair Grooms, MD   Chief Complaint  Patient presents with  . Atrial Fibrillation  . Coronary Artery Disease    History of Present Illness:  Timothy Harper is a 83 y.o. male with history of coronary artery disease with prior coronary bypass grafting and native vessel PCI, tachybradycardia syndrome with atrial fibrillation, hypertension, embolic CVA, and chronic anticoagulation therapy.  [place] was seen 1 year ago. Over the past 2-3 months he has noted exertional dyspnea. States he did not exercise or do much activity over the winter because of the weather. Over the past 2 months he has tried to increase physical activity but notices significant shortness of breath with mild to moderate exertion. He denies orthopnea but states that for the past month sleeping his been difficult and he is from bed to recliner. He denies chest discomfort. He is compliant with his medical regimen. He feels he is gain weight. No lower extremity swelling. He has had no recent changes in his medical regimen. He has not been following on a regular basis with the Coumadin clinic at [facility].   Past Medical History:  Diagnosis Date  . 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 Atrial fibrillation ([place])   . BPH (benign prostatic hyperplasia)   . Cardiac pacemaker in situ   . Chronic atrial fibrillation ([place])   . Coronary artery disease involving coronary bypass graft 1994   LIMA to RCA . Prior PCI on RCA with restenosis  . CVA (cerebral infarction) 1995  . CVA (cerebral infarction)    Right  . Erectile dysfunction   . GERD (gastroesophageal reflux disease)   . Hearing loss    hearing aids  . Herpes zoster   . HTN (hypertension)   . Hyperlipemia   . Hypothyroidism   . Long term (current) use of anticoagulants   . Melanoma [facility])    right ear,  Rosana Hoes 2002 and left arm 1997, [place]  . Primary male hypogonadism     No past surgical history on file.  Current Medications: Outpatient Medications Prior to Visit  Medication Sig Dispense Refill  . atorvastatin (LIPITOR) 20 MG tablet Take 20 mg by mouth daily.    . DENTA 5000 PLUS 1.1 % CREA dental cream Apply 1 application topically 2 (two) times daily.    . digoxin (DIGOX) 0.125 MG tablet Take 0.125 mg by mouth 3 (three) times a week.     . diltiazem (TIAZAC) 180 MG 24 hr capsule Take 180 mg by mouth daily.    Marland Kitchen levothyroxine (SYNTHROID, LEVOTHROID) 112 MCG tablet Take 112 mcg by mouth daily before breakfast.    . Magnesium Oxide (MAG-200) 200 MG TABS Take 200 mg by mouth as directed.     . metoprolol succinate (TOPROL-XL) 50 MG 24 hr tablet Take 1 tablet (50 mg total) by mouth 2 (two) times daily. 60 tablet 11  . omeprazole (PRILOSEC) 20 MG capsule Take 20 mg by mouth as directed. Take as needed    . potassium chloride SA (K-DUR,KLOR-CON) 20 MEQ tablet Take 20 mEq by mouth daily.    . sildenafil (VIAGRA) 100 MG tablet Take 100 mg by mouth daily as needed for erectile dysfunction.    . tamsulosin (FLOMAX) 0.4 MG CAPS capsule Take 0.4 mg by mouth daily.    Marland Kitchen  Testosterone (FORTESTA) 10 MG/ACT (2%) GEL Place onto the skin. 4 pumps daily    . vardenafil (LEVITRA) 20 MG tablet Take 20 mg by mouth daily as needed for erectile dysfunction.    Marland Kitchen warfarin (COUMADIN) 5 MG tablet Take 5 mg by mouth as needed.    . furosemide (LASIX) 20 MG tablet Take 20 mg by mouth daily.     No facility-administered medications prior to visit.      Allergies:   Penicillins and Prednisone   Social History   Social History  . Marital status: Married    Spouse name: N/A  . Number of children: 4  . Years of education: N/A   Occupational History  . financial advisor     retired   Social History Main Topics  . Smoking status: Former Smoker    Quit date: 08/01/1988  . Smokeless tobacco: Never Used   . Alcohol use Yes     Comment: 2-3 times a week  . Drug use: No  . Sexual activity: Not Asked   Other Topics Concern  . None   Social History Narrative  . None     Family History:  The patient's family history includes Heart disease in his mother.   ROS:   Please see the history of present illness.    Excessive fatigue, rapid heart beating, unexplained weight gain, difficulty with balance.  All other systems reviewed and are negative.   PHYSICAL EXAM:   VS:  BP 114/70 (BP Location: Left Arm)   Pulse (!) 105   Ht _0  (1.803 m)   Wt 227 lb 3.2 oz (103.1 kg)   BMI 31.69 kg/m    GEN: Well nourished, well developed, in no acute distress  HEENT: normal  Neck: no JVD, carotid bruits, or masses Cardiac:rapid IIRR; no murmurs, rubs, or gallops,no edema  Respiratory:  clear to auscultation bilaterally, normal work of breathing GI: soft, nontender, nondistended, + BS MS: no deformity or atrophy  Skin: warm and dry, no rash Neuro:  Alert and Oriented x 3, Strength and sensation are intact Psych: euthymic mood, full affect  Wt Readings from Last 3 Encounters:  07/09/16 227 lb 3.2 oz (103.1 kg)  02/06/16 223 lb (101.2 kg)  12/12/15 224 lb 3.2 oz (101.7 kg)      Studies/Labs Reviewed:   EKG:  EKG  Atrial fibrillation with rapid ventricular response at 105 bpm.   Recent Labs: No results found for requested labs within last 8760 hours.   Lipid Panel No results found for: CHOL, TRIG, HDL, CHOLHDL, VLDL, LDLCALC, LDLDIRECT  Additional studies/ records that were reviewed today include:  No recent imaging or functional studies.    ASSESSMENT:    1. Chronic atrial fibrillation ([place])   2. Shortness of breath   3. Coronary artery disease involving coronary bypass graft of native heart with angina pectoris ([place])   4. Sinus node dysfunction (HCC)   5. Pacemaker   6. Essential hypertension   7. Long term current use of therapeutic drug      PLAN:  In order of  problems listed above:  1. Atrial fibrillation with poor rate control. This is different than on previous evaluations on the same medication regimen. I wonder if he is compliant with medical therapy. I wonder if LV function has significantly decreased. 2. Rule out LV systolic/diastolic heart failure with echocardiogram, BNP, and CBC. I am increasing furosemide to 40 mg daily. Basic metabolic panel will be repeated in one week.  Office visit in one week. 3. No angina although dyspnea could be an anginal equivalent. If LV function is normal and blood work unremarkable, he will need to have an ischemic evaluation to rule out bypass graft occlusion or progression of native vessel disease. 4. Permanent pacemaker and rate controlling agents are being used for the tachybradycardia syndrome 5. Normal function when last evaluated 6. 2 g sodium diet. 7. Encouraged follow-up with primary care at their Coumadin clinic. He has not been in quite some time.  Suspicious that he is in heart failure. Mild neck vein elevation is noted. No rales are heard on exam. BNP, kidney function, hemoglobin, echo while be done to help confirm the diagnosis. In the meantime I will increase furosemide to 40 mg daily from 20 mg daily. Follow-up in 7-10 days with be met  Medication Adjustments/Labs and Tests Ordered: Current medicines are reviewed at length with the patient today.  Concerns regarding medicines are outlined above.  Medication changes, Labs and Tests ordered today are listed in the Patient Instructions below. Patient Instructions  Medication Instructions:  1) INCREASE Furosemide to 65m once daily  Labwork: CMET, BNP, Dig level, and CBC today  Testing/Procedures: Your physician has requested that you have an echocardiogram. Echocardiography is a painless test that uses sound waves to create images of your heart. It provides your doctor with information about the size and shape of your heart and how well your  heart's chambers and valves are working. This procedure takes approximately one hour. There are no restrictions for this procedure.   Follow-Up: Your physician recommends that you schedule a follow-up appointment in: 7 days with Dr. STamala Julianwith a repeat BMET at that time.    Any Other Special Instructions Will Be Listed Below (If Applicable).     If you need a refill on your cardiac medications before your next appointment, please call your pharmacy.      Signed, HSinclair Grooms MD  07/09/2016 2:19 PM    [facility] [address] [place]  [id number]: [phone] Fax: [phone]

## 2016-07-09 NOTE — Patient Instructions (Addendum)
Medication Instructions:  1) INCREASE Furosemide to 40mg  once daily  Labwork: CMET, BNP, Dig level, TSH and CBC today  Testing/Procedures: Your physician has requested that you have an echocardiogram. Echocardiography is a painless test that uses sound waves to create images of your heart. It provides your doctor with information about the size and shape of your heart and how well your heart's chambers and valves are working. This procedure takes approximately one hour. There are no restrictions for this procedure.   Follow-Up: Your physician recommends that you schedule a follow-up appointment in: 7 days with Dr. Tamala Julian with a repeat BMET at that time.    Any Other Special Instructions Will Be Listed Below (If Applicable).     If you need a refill on your cardiac medications before your next appointment, please call your pharmacy.

## 2016-07-10 LAB — COMPREHENSIVE METABOLIC PANEL
A/G RATIO: 1.8 (ref 1.2–2.2)
ALBUMIN: 4.6 g/dL (ref 3.5–4.7)
ALT: 19 IU/L (ref 0–44)
AST: 18 IU/L (ref 0–40)
Alkaline Phosphatase: 99 IU/L (ref 39–117)
BUN / CREAT RATIO: 12 (ref 10–24)
BUN: 16 mg/dL (ref 8–27)
Bilirubin Total: 1 mg/dL (ref 0.0–1.2)
CALCIUM: 9.5 mg/dL (ref 8.6–10.2)
CO2: 24 mmol/L (ref 18–29)
Chloride: 103 mmol/L (ref 96–106)
Creatinine, Ser: 1.32 mg/dL — ABNORMAL HIGH (ref 0.76–1.27)
GFR, EST AFRICAN AMERICAN: 58 mL/min/{1.73_m2} — AB (ref >59)
GFR, EST NON AFRICAN AMERICAN: 50 mL/min/{1.73_m2} — AB (ref >59)
Globulin, Total: 2.5 g/dL (ref 1.5–4.5)
Glucose: 104 mg/dL — ABNORMAL HIGH (ref 65–99)
POTASSIUM: 4.7 mmol/L (ref 3.5–5.2)
Sodium: 142 mmol/L (ref 134–144)
TOTAL PROTEIN: 7.1 g/dL (ref 6.0–8.5)

## 2016-07-10 LAB — PROTIME-INR
INR: 1.7 — ABNORMAL HIGH (ref 0.8–1.2)
PROTHROMBIN TIME: 17.4 s — AB (ref 9.1–12.0)

## 2016-07-10 LAB — DIGOXIN LEVEL: DIGOXIN, SERUM: 0.7 ng/mL (ref 0.5–0.9)

## 2016-07-10 LAB — CBC
HEMATOCRIT: 43.6 % (ref 37.5–51.0)
HEMOGLOBIN: 14.7 g/dL (ref 13.0–17.7)
MCH: 31.1 pg (ref 26.6–33.0)
MCHC: 33.7 g/dL (ref 31.5–35.7)
MCV: 92 fL (ref 79–97)
Platelets: 214 10*3/uL (ref 150–379)
RBC: 4.72 x10E6/uL (ref 4.14–5.80)
RDW: 14 % (ref 12.3–15.4)
WBC: 5.9 10*3/uL (ref 3.4–10.8)

## 2016-07-10 LAB — PRO B NATRIURETIC PEPTIDE: NT-Pro BNP: 1025 pg/mL — ABNORMAL HIGH (ref 0–486)

## 2016-07-10 LAB — TSH: TSH: 2.8 u[IU]/mL (ref 0.450–4.500)

## 2016-07-15 ENCOUNTER — Encounter: Payer: Self-pay | Admitting: Interventional Cardiology

## 2016-07-15 DIAGNOSIS — I5042 Chronic combined systolic (congestive) and diastolic (congestive) heart failure: Secondary | ICD-10-CM | POA: Insufficient documentation

## 2016-07-15 NOTE — Progress Notes (Signed)
Cardiology Office Note    Date:  07/16/2016   ID:  Timothy Harper, DOB [birth year], MRN [id number]  PCP:  Lavone Orn, MD  Cardiologist: Sinclair Grooms, MD   Chief Complaint  Patient presents with  . Congestive Heart Failure  . Atrial Fibrillation    History of Present Illness:  Timothy Harper is a 83 y.o. male  with history of coronary artery disease with prior coronary bypass grafting and native vessel PCI, tachybradycardia syndrome with atrial fibrillation, hypertension, embolic CVA, and chronic anticoagulation therapy.  He feels slightly better now. He did miss one dose of Lasix. His weight is down 4 pounds. The breathing is better than when seen last week. It is not back to normal. He denies angina. He is aggravated by all the blood work and withdrawal.  Past Medical History:  Diagnosis Date  . 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 Atrial fibrillation ([place])   . BPH (benign prostatic hyperplasia)   . Cardiac pacemaker in situ   . Chronic atrial fibrillation ([place])   . Coronary artery disease involving coronary bypass graft 1994   LIMA to RCA . Prior PCI on RCA with restenosis  . CVA (cerebral infarction) 1995  . CVA (cerebral infarction)    Right  . Erectile dysfunction   . GERD (gastroesophageal reflux disease)   . Hearing loss    hearing aids  . Herpes zoster   . HTN (hypertension)   . Hyperlipemia   . Hypothyroidism   . Long term (current) use of anticoagulants   . Melanoma [facility])    right ear, Rosana Hoes 2002 and left arm 1997, [place]  . Primary male hypogonadism     No past surgical history on file.  Current Medications: Outpatient Medications Prior to Visit  Medication Sig Dispense Refill  . atorvastatin (LIPITOR) 20 MG tablet Take 20 mg by mouth daily.    . DENTA 5000 PLUS 1.1 % CREA dental cream Apply 1 application topically 2 (two) times daily.    . digoxin (DIGOX) 0.125 MG tablet Take 0.125 mg by mouth 3 (three)  times a week.     . diltiazem (TIAZAC) 180 MG 24 hr capsule Take 180 mg by mouth daily.    . furosemide (LASIX) 40 MG tablet Take 1 tablet (40 mg total) by mouth daily. 90 tablet 3  . levothyroxine (SYNTHROID, LEVOTHROID) 112 MCG tablet Take 112 mcg by mouth daily before breakfast.    . Magnesium Oxide (MAG-200) 200 MG TABS Take 200 mg by mouth as directed.     . metoprolol succinate (TOPROL-XL) 50 MG 24 hr tablet Take 1 tablet (50 mg total) by mouth 2 (two) times daily. 60 tablet 11  . omeprazole (PRILOSEC) 20 MG capsule Take 20 mg by mouth as directed. Take as needed    . potassium chloride SA (K-DUR,KLOR-CON) 20 MEQ tablet Take 20 mEq by mouth daily.    . sildenafil (VIAGRA) 100 MG tablet Take 100 mg by mouth daily as needed for erectile dysfunction.    . tamsulosin (FLOMAX) 0.4 MG CAPS capsule Take 0.4 mg by mouth daily.    . Testosterone (FORTESTA) 10 MG/ACT (2%) GEL Place onto the skin. 4 pumps daily    . vardenafil (LEVITRA) 20 MG tablet Take 20 mg by mouth daily as needed for erectile dysfunction.    Marland Kitchen warfarin (COUMADIN) 5 MG tablet Take 5 mg by mouth as needed.     No  facility-administered medications prior to visit.      Allergies:   Penicillins and Prednisone   Social History   Social History  . Marital status: Married    Spouse name: N/A  . Number of children: 4  . Years of education: N/A   Occupational History  . financial advisor     retired   Social History Main Topics  . Smoking status: Former Smoker    Quit date: 08/01/1988  . Smokeless tobacco: Never Used  . Alcohol use Yes     Comment: 2-3 times a week  . Drug use: No  . Sexual activity: Not Asked   Other Topics Concern  . None   Social History Narrative  . None     Family History:  The patient's family history includes Heart disease in his mother.   ROS:   Please see the history of present illness.    Still shortness of breath with activity. Leg swelling is improving. Occasional palpitations are  noted.  All other systems reviewed and are negative.   PHYSICAL EXAM:   VS:  BP 112/76 (BP Location: Right Arm)   Pulse 83   Ht 5\' 11"  (1.803 m)   Wt 224 lb 12.8 oz (102 kg)   BMI 31.35 kg/m    GEN: Well nourished, well developed, in no acute distress  HEENT: normal  Neck: no JVD, carotid bruits, or masses Cardiac: iiRR; no murmurs, rubs, or gallops,no edema  Respiratory:  clear to auscultation bilaterally, normal work of breathing GI: soft, nontender, nondistended, + BS MS: no deformity or atrophy  Skin: warm and dry, no rash Neuro:  Alert and Oriented x 3, Strength and sensation are intact Psych: euthymic mood, full affect  Wt Readings from Last 3 Encounters:  07/16/16 224 lb 12.8 oz (102 kg)  07/09/16 227 lb 3.2 oz (103.1 kg)  02/06/16 223 lb (101.2 kg)      Studies/Labs Reviewed:   EKG:  EKG  Not repeated  Recent Labs: 07/09/2016: ALT 19; BUN 16; Creatinine, Ser 1.32; NT-Pro BNP 1,025; Platelets 214; Potassium 4.7; Sodium 142; TSH 2.800   Lipid Panel No results found for: CHOL, TRIG, HDL, CHOLHDL, VLDL, LDLCALC, LDLDIRECT  Additional studies/ records that were reviewed today include:  Please note laboratory data above which includes a proBNP of 1025.    ASSESSMENT:    1. Chronic atrial fibrillation ([place])   2. Coronary artery disease involving coronary bypass graft of native heart with angina pectoris ([place])   3. Essential hypertension   4. Sinus node dysfunction (HCC)   5. Acute on chronic combined systolic and diastolic CHF (congestive heart failure) ([place])      PLAN:  In order of problems listed above:  1. Heart rate is slower, likely related to diuresis and improvement in what is presumed to be acute on chronic combined systolic and diastolic heart failure. 2. The specific anginal complaints. 3. The blood pressure is stable. 4. Not addressed 5. Continue higher dose furosemide, 40 mg daily. A basic metabolic panel is being obtained today to help adjust  diuretic therapy.  Clinical follow-up with me in one month. Continue higher dose furosemide which may be altered based upon today's laboratory data.  Medication Adjustments/Labs and Tests Ordered: Current medicines are reviewed at length with the patient today.  Concerns regarding medicines are outlined above.  Medication changes, Labs and Tests ordered today are listed in the Patient Instructions below. There are no Patient Instructions on file for this visit.  Signed, Sinclair Grooms, MD  07/16/2016 12:13 PM    [facility] [address], [place], [place]  [address] Phone: [phone]; Fax: [phone]

## 2016-07-16 ENCOUNTER — Ambulatory Visit (INDEPENDENT_AMBULATORY_CARE_PROVIDER_SITE_OTHER): Payer: Medicare Other | Admitting: Interventional Cardiology

## 2016-07-16 ENCOUNTER — Encounter: Payer: Self-pay | Admitting: Interventional Cardiology

## 2016-07-16 ENCOUNTER — Other Ambulatory Visit: Payer: Medicare Other | Admitting: *Deleted

## 2016-07-16 VITALS — BP 112/76 | HR 83 | Ht 71.0 in | Wt 224.8 lb

## 2016-07-16 DIAGNOSIS — I495 Sick sinus syndrome: Secondary | ICD-10-CM

## 2016-07-16 DIAGNOSIS — I482 Chronic atrial fibrillation, unspecified: Secondary | ICD-10-CM

## 2016-07-16 DIAGNOSIS — I10 Essential (primary) hypertension: Secondary | ICD-10-CM

## 2016-07-16 DIAGNOSIS — I25709 Atherosclerosis of coronary artery bypass graft(s), unspecified, with unspecified angina pectoris: Secondary | ICD-10-CM

## 2016-07-16 DIAGNOSIS — I5043 Acute on chronic combined systolic (congestive) and diastolic (congestive) heart failure: Secondary | ICD-10-CM | POA: Diagnosis not present

## 2016-07-16 NOTE — Patient Instructions (Signed)
Medication Instructions:  None  Labwork: BMET today  Testing/Procedures: None  Follow-Up: Your physician recommends that you schedule a follow-up appointment in: 1 month with Dr. Tamala Julian (Can have 6/13 or 6/15 at 12pm)   Any Other Special Instructions Will Be Listed Below (If Applicable).     If you need a refill on your cardiac medications before your next appointment, please call your pharmacy.

## 2016-07-17 LAB — BASIC METABOLIC PANEL
BUN/Creatinine Ratio: 13 (ref 10–24)
BUN: 19 mg/dL (ref 8–27)
CALCIUM: 9.6 mg/dL (ref 8.6–10.2)
CHLORIDE: 102 mmol/L (ref 96–106)
CO2: 24 mmol/L (ref 18–29)
Creatinine, Ser: 1.51 mg/dL — ABNORMAL HIGH (ref 0.76–1.27)
GFR calc non Af Amer: 42 mL/min/{1.73_m2} — ABNORMAL LOW (ref >59)
GFR, EST AFRICAN AMERICAN: 49 mL/min/{1.73_m2} — AB (ref >59)
GLUCOSE: 96 mg/dL (ref 65–99)
POTASSIUM: 5.3 mmol/L — AB (ref 3.5–5.2)
Sodium: 142 mmol/L (ref 134–144)

## 2016-07-21 ENCOUNTER — Telehealth: Payer: Self-pay | Admitting: *Deleted

## 2016-07-21 DIAGNOSIS — E875 Hyperkalemia: Secondary | ICD-10-CM

## 2016-07-21 MED ORDER — POTASSIUM CHLORIDE ER 10 MEQ PO TBCR
10.0000 meq | EXTENDED_RELEASE_TABLET | Freq: Every day | ORAL | 3 refills | Status: DC
Start: 2016-07-21 — End: 2016-07-31

## 2016-07-21 NOTE — Telephone Encounter (Signed)
-----   Message from Belva Crome, MD sent at 07/17/2016  8:10 AM EDT ----- Let the patient know blood may be hemolyzed and therefore potassium secondarily elevated. In the interim decrease potassium to 10 mEq per day. Continue 40 mg of furosemide. Pacing metabolic panel in one week. A copy will be sent to Lavone Orn, MD

## 2016-07-21 NOTE — Telephone Encounter (Signed)
Spoke with pt and went over results and recommendations per Dr. Tamala Julian.  Pt verbalized understanding and was in agreement with this plan.  He will come for repeat labs on 07/29/16.

## 2016-07-23 ENCOUNTER — Ambulatory Visit (HOSPITAL_COMMUNITY): Payer: Medicare Other | Attending: Cardiovascular Disease

## 2016-07-23 ENCOUNTER — Other Ambulatory Visit: Payer: Self-pay

## 2016-07-23 DIAGNOSIS — Z950 Presence of cardiac pacemaker: Secondary | ICD-10-CM

## 2016-07-23 DIAGNOSIS — I482 Chronic atrial fibrillation, unspecified: Secondary | ICD-10-CM

## 2016-07-23 DIAGNOSIS — R0602 Shortness of breath: Secondary | ICD-10-CM | POA: Diagnosis present

## 2016-07-23 DIAGNOSIS — I25709 Atherosclerosis of coronary artery bypass graft(s), unspecified, with unspecified angina pectoris: Secondary | ICD-10-CM

## 2016-07-23 DIAGNOSIS — I10 Essential (primary) hypertension: Secondary | ICD-10-CM

## 2016-07-23 DIAGNOSIS — Z87891 Personal history of nicotine dependence: Secondary | ICD-10-CM | POA: Insufficient documentation

## 2016-07-23 DIAGNOSIS — E785 Hyperlipidemia, unspecified: Secondary | ICD-10-CM | POA: Diagnosis not present

## 2016-07-23 DIAGNOSIS — Z8249 Family history of ischemic heart disease and other diseases of the circulatory system: Secondary | ICD-10-CM | POA: Insufficient documentation

## 2016-07-23 DIAGNOSIS — I495 Sick sinus syndrome: Secondary | ICD-10-CM

## 2016-07-23 DIAGNOSIS — Z79899 Other long term (current) drug therapy: Secondary | ICD-10-CM | POA: Diagnosis not present

## 2016-07-29 ENCOUNTER — Telehealth: Payer: Self-pay | Admitting: Interventional Cardiology

## 2016-07-29 ENCOUNTER — Other Ambulatory Visit: Payer: Medicare Other

## 2016-07-29 DIAGNOSIS — E875 Hyperkalemia: Secondary | ICD-10-CM

## 2016-07-29 NOTE — Telephone Encounter (Signed)
Spoke with pt and he states that since we cut his K+ back to 47mEq QD he has been having significant cramps in his legs.  K+ was decreased due to K+ level of 5.3.  Pt came in today for repeat labs.  Pt would like to know if we can increase K+ back to 75mEq QD?  Advised I would send message to Dr. Tamala Julian and once labs were received for him to review, I would call back with recommendations.  Pt verbalized understanding and was appreciative for call.

## 2016-07-29 NOTE — Telephone Encounter (Signed)
Agree. If labs allow, perhaps.

## 2016-07-30 LAB — BASIC METABOLIC PANEL
BUN/Creatinine Ratio: 18 (ref 10–24)
BUN: 23 mg/dL (ref 8–27)
CHLORIDE: 102 mmol/L (ref 96–106)
CO2: 23 mmol/L (ref 18–29)
Calcium: 9.2 mg/dL (ref 8.6–10.2)
Creatinine, Ser: 1.3 mg/dL — ABNORMAL HIGH (ref 0.76–1.27)
GFR calc Af Amer: 59 mL/min/{1.73_m2} — ABNORMAL LOW (ref >59)
GFR calc non Af Amer: 51 mL/min/{1.73_m2} — ABNORMAL LOW (ref >59)
Glucose: 97 mg/dL (ref 65–99)
POTASSIUM: 4.3 mmol/L (ref 3.5–5.2)
Sodium: 141 mmol/L (ref 134–144)

## 2016-07-30 NOTE — Telephone Encounter (Signed)
Attempted to call pt.  No answer and no VM option.  Will try again later.

## 2016-07-30 NOTE — Telephone Encounter (Signed)
Spoke with pt and went over labs and recommendations.  Pt still concerned about cramping he is having in his legs.  States they are severe and he fell the other day when getting out of bed because his legs were cramping so bad.  Pt would like to try alternating K+ 35mEq one day and 77mEq the next if Dr. Tamala Julian is agreeable?  Or would like a suggestion on what to do about the cramps?  Advised I would send message to Dr. Tamala Julian for review.

## 2016-07-30 NOTE — Telephone Encounter (Signed)
Patient returning your call, thanks. °

## 2016-07-31 MED ORDER — POTASSIUM CHLORIDE CRYS ER 20 MEQ PO TBCR: 20.0000 meq | EXTENDED_RELEASE_TABLET | Freq: Every day | ORAL | 3 refills | Status: DC

## 2016-07-31 NOTE — Telephone Encounter (Signed)
Called pt and made him aware ok to change K+ to 13meQ QD.  Pt verbalized understanding and was appreciative for call.

## 2016-07-31 NOTE — Telephone Encounter (Signed)
Resume 20 meq daily.We should be okay.

## 2016-08-07 LAB — PROTIME-INR: INR: 2.9 — AB (ref 0.9–1.1)

## 2016-08-11 ENCOUNTER — Other Ambulatory Visit: Payer: Self-pay | Admitting: Internal Medicine

## 2016-08-11 ENCOUNTER — Ambulatory Visit
Admission: RE | Admit: 2016-08-11 | Discharge: 2016-08-11 | Disposition: A | Discharge status: Home / Self Care | Payer: Medicare Other | Source: Ambulatory Visit | Attending: Internal Medicine | Admitting: Internal Medicine

## 2016-08-11 DIAGNOSIS — M1990 Unspecified osteoarthritis, unspecified site: Secondary | ICD-10-CM

## 2016-08-12 NOTE — Progress Notes (Signed)
Cardiology Office Note    Date:  08/13/2016   ID:  Timothy Harper, DOB 18-Jul-1932, MRN [id number]  PCP:  Timothy Orn, MD  Cardiologist: Timothy Grooms, MD   Chief Complaint  Patient presents with  . chronic atrial fibrillation    follow up    History of Present Illness:  Timothy Harper is a 83 y.o. male with history of coronary artery disease with prior coronary bypass grafting and native vessel PCI, tachybradycardia syndrome with atrial fibrillation, hypertension, embolic CVA, and chronic anticoagulation therapy.  Overall he feels better than in early May when he had significant orthopnea. Evaluation identified LVEF 30-35%. This occurred in the absence of chest discomfort. It is very difficult to get a history from [name]. He has not used nitroglycerin. He was having orthopnea which is been the most part improved. He still has mild orthopnea.  He was diagnosed with right lower extremity gout 48 hours ago. He is now on cultures seen and kETROLAC. The right foot is improving.  Past Medical History:  Diagnosis Date  . 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 Atrial fibrillation ([name])   . BPH (benign prostatic hyperplasia)   . Cardiac pacemaker in situ   . Chronic atrial fibrillation ([name])   . Coronary artery disease involving coronary bypass graft 1994   LIMA to RCA . Prior PCI on RCA with restenosis  . CVA (cerebral infarction) 1995  . CVA (cerebral infarction)    Right  . Erectile dysfunction   . GERD (gastroesophageal reflux disease)   . Hearing loss    hearing aids  . Herpes zoster   . HTN (hypertension)   . Hyperlipemia   . Hypothyroidism   . Long term (current) use of anticoagulants   . Melanoma [facility])    right ear, Rosana Hoes 2002 and left arm 1997, [place]  . Primary male hypogonadism     No past surgical history on file.  Current Medications: Outpatient Medications Prior to Visit  Medication Sig Dispense Refill  .  atorvastatin (LIPITOR) 20 MG tablet Take 20 mg by mouth daily.    . DENTA 5000 PLUS 1.1 % CREA dental cream Apply 1 application topically 2 (two) times daily.    . digoxin (DIGOX) 0.125 MG tablet Take 0.125 mg by mouth 3 (three) times a week.     . diltiazem (TIAZAC) 180 MG 24 hr capsule Take 180 mg by mouth daily.    . furosemide (LASIX) 40 MG tablet Take 1 tablet (40 mg total) by mouth daily. 90 tablet 3  . levothyroxine (SYNTHROID, LEVOTHROID) 112 MCG tablet Take 112 mcg by mouth daily before breakfast.    . Magnesium Oxide (MAG-200) 200 MG TABS Take 200 mg by mouth as directed.     . metoprolol succinate (TOPROL-XL) 50 MG 24 hr tablet Take 1 tablet (50 mg total) by mouth 2 (two) times daily. 60 tablet 11  . omeprazole (PRILOSEC) 20 MG capsule Take 20 mg by mouth as directed.     . potassium chloride SA (K-DUR,KLOR-CON) 20 MEQ tablet Take 1 tablet (20 mEq total) by mouth daily. 90 tablet 3  . sildenafil (VIAGRA) 100 MG tablet Take 100 mg by mouth daily as needed for erectile dysfunction.    . tamsulosin (FLOMAX) 0.4 MG CAPS capsule Take 0.4 mg by mouth daily.    . Testosterone (FORTESTA) 10 MG/ACT (2%) GEL Place onto the skin. 4 pumps daily    .  vardenafil (LEVITRA) 20 MG tablet Take 20 mg by mouth daily as needed for erectile dysfunction.    Marland Kitchen warfarin (COUMADIN) 5 MG tablet Take 5 mg by mouth as directed.      No facility-administered medications prior to visit.      Allergies:   Penicillins and Prednisone   Social History   Social History  . Marital status: Married    Spouse name: N/A  . Number of children: 4  . Years of education: N/A   Occupational History  . financial advisor     retired   Social History Main Topics  . Smoking status: Former Smoker    Quit date: 08/01/1988  . Smokeless tobacco: Never Used  . Alcohol use Yes     Comment: 2-3 times a week  . Drug use: No  . Sexual activity: Not Asked   Other Topics Concern  . None   Social History Narrative  . None       Family History:  The patient's family history includes Heart disease in his mother.   ROS:   Please see the history of present illness.    Difficulty lying. History of CVA 62. On chronic anticoagulation therapy because of atrial fibrillation.  All other systems reviewed and are negative.   PHYSICAL EXAM:   VS:  BP 100/68   Pulse 84   Ht 5\' 11"  (1.803 m)   Wt 225 lb 6.4 oz (102.2 kg)   BMI 31.44 kg/m    GEN: Well nourished, well developed, in no acute distress  HEENT: normal  Neck: no JVD, carotid bruits, or masses Cardiac: IIRR; no murmurs, rubs, or gallops,no edema  Respiratory:  clear to auscultation bilaterally, normal work of breathing GI: soft, nontender, nondistended, + BS MS: no deformity or atrophy  Skin: warm and dry, no rash Neuro:  Alert and Oriented x 3, Strength and sensation are intact Psych: euthymic mood, full affect  Wt Readings from Last 3 Encounters:  08/13/16 225 lb 6.4 oz (102.2 kg)  07/16/16 224 lb 12.8 oz (102 kg)  07/09/16 227 lb 3.2 oz (103.1 kg)      Studies/Labs Reviewed:   EKG:  EKG  Not repeated.  Recent Labs: 07/09/2016: ALT 19; Hemoglobin 14.7; NT-Pro BNP 1,025; Platelets 214; TSH 2.800 07/29/2016: BUN 23; Creatinine, Ser 1.30; Potassium 4.3; Sodium 141   Lipid Panel No results found for: CHOL, TRIG, HDL, CHOLHDL, VLDL, LDLCALC, LDLDIRECT  Additional studies/ records that were reviewed today include:   Echocardiogram May 2018 Study Conclusions   - Left ventricle: The cavity size was normal. Wall thickness was   normal. Systolic function was moderately to severely reduced. The   estimated ejection fraction was in the range of 30% to 35%.   Diffuse hypokinesis. - Mitral valve: There was mild to moderate regurgitation. - Left atrium: The atrium was severely dilated. - Right ventricle: The cavity size was moderately dilated. - Right atrium: The atrium was moderately dilated.      ASSESSMENT:    1. Coronary artery disease  involving coronary bypass graft of native heart with angina pectoris ([place])   2. Chronic atrial fibrillation (HCC)   3. Acute on chronic combined systolic and diastolic CHF (congestive heart failure) ([place])   4. Essential hypertension   5. Sinus node dysfunction (HCC)      PLAN:  In order of problems listed above:  73. 83 year old bypass grafts. No reevaluation recently. Now with new systolic heart failure of uncertain etiology. Needs coronary angiography  to rule out silent graft occlusion and resultant ischemic cardiomyopathy do to graft failure. Procedure and risks were discussed with the patient. 2. 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 We will have him followed in our Coumadin clinic in preparation for cath. 3. Volume status is improved. Orthopnea is improved. Continue current dose of furosemide. Blood work will be done soon for the upcoming cardiac cath. 4. Low normal on current medical regimen. No change.  The patient was counseled to undergo left and right heart catheterization, coronary and bypass graft angiography, and possible percutaneous coronary intervention with stent implantation. The procedural risks and benefits were discussed in detail. The risks discussed included death, stroke, myocardial infarction, life-threatening bleeding, limb ischemia, kidney injury, allergy, and possible emergency cardiac surgery. The risk of these significant complications were estimated to occur less than 1% of the time. After discussion, the patient has agreed to proceed.    Medication Adjustments/Labs and Tests Ordered: Current medicines are reviewed at length with the patient today.  Concerns regarding medicines are outlined above.  Medication changes, Labs and Tests ordered today are listed in the Patient Instructions below. There are no Patient Instructions on file for this visit.   Signed, Timothy Grooms,  MD  08/13/2016 12:26 PM    [address], [place], [place]  [address] Phone: [phone]; Fax: [phone]

## 2016-08-13 ENCOUNTER — Ambulatory Visit (INDEPENDENT_AMBULATORY_CARE_PROVIDER_SITE_OTHER): Payer: Medicare Other | Admitting: Interventional Cardiology

## 2016-08-13 ENCOUNTER — Encounter: Payer: Self-pay | Admitting: Interventional Cardiology

## 2016-08-13 ENCOUNTER — Encounter: Payer: Medicare Other | Admitting: *Deleted

## 2016-08-13 ENCOUNTER — Telehealth: Payer: Self-pay | Admitting: *Deleted

## 2016-08-13 ENCOUNTER — Encounter: Payer: Self-pay | Admitting: *Deleted

## 2016-08-13 VITALS — BP 100/68 | HR 84 | Ht 71.0 in | Wt 225.4 lb

## 2016-08-13 DIAGNOSIS — I639 Cerebral infarction, unspecified: Secondary | ICD-10-CM | POA: Insufficient documentation

## 2016-08-13 DIAGNOSIS — I482 Chronic atrial fibrillation, unspecified: Secondary | ICD-10-CM

## 2016-08-13 DIAGNOSIS — I5043 Acute on chronic combined systolic (congestive) and diastolic (congestive) heart failure: Secondary | ICD-10-CM

## 2016-08-13 DIAGNOSIS — I10 Essential (primary) hypertension: Secondary | ICD-10-CM | POA: Diagnosis not present

## 2016-08-13 DIAGNOSIS — I25709 Atherosclerosis of coronary artery bypass graft(s), unspecified, with unspecified angina pectoris: Secondary | ICD-10-CM

## 2016-08-13 DIAGNOSIS — I495 Sick sinus syndrome: Secondary | ICD-10-CM

## 2016-08-13 NOTE — Progress Notes (Signed)
This encounter was created in error - please disregard.  This encounter was created in error - please disregard.

## 2016-08-13 NOTE — Patient Instructions (Signed)
Medication Instructions:  None  Labwork: Your physician recommends that you return for lab work prior to your catheterization.   Testing/Procedures: Your physician has requested that you have a cardiac catheterization. Cardiac catheterization is used to diagnose and/or treat various heart conditions. Doctors may recommend this procedure for a number of different reasons. The most common reason is to evaluate chest pain. Chest pain can be a symptom of coronary artery disease (CAD), and cardiac catheterization can show whether plaque is narrowing or blocking your heart's arteries. This procedure is also used to evaluate the valves, as well as measure the blood flow and oxygen levels in different parts of your heart. For further information please visit [email or web address]. Please follow instruction sheet, as given.   Follow-Up: Your physician recommends that you schedule a follow-up appointment in: 1 month with Dr. Tamala Julian.    Any Other Special Instructions Will Be Listed Below (If Applicable).     If you need a refill on your cardiac medications before your next appointment, please call your pharmacy.

## 2016-08-13 NOTE — Telephone Encounter (Signed)
Pt was seen by Dr. Tamala Julian today & was scheduled for a Heart Cath on 08/26/16 & Dr. Tamala Julian would like the pt Lovenox Bridged. Appt made for  Lovenox bridge today but pt was sent home because he did not know dosing information & he was instructed we would call him with an appt.  Called pt & set an appt for next next week when he has a lab appt. Verbalized multiple times that he has to take the Lovenox injections in his abdomen twice a day prior to & after procedure. He states he does not think he can do this but will try to find someone to assist him & advised to bring that person with him on that day.   Also, spoke with Kennyth Lose at Dr. Delene Ruffini office (PCP) & she stated that the pt was last seen by them on 08/07/16- INR was 2.9 & he takes 5mg  daily except NO Coumadin on Sundays (using a 5mg  tablet). Prior to this visit the pt was last seen by them on 02/2016 & INR was 2.2; also a copy of 08/07/16 was faxed over for our records.

## 2016-08-15 ENCOUNTER — Other Ambulatory Visit: Payer: Self-pay | Admitting: Interventional Cardiology

## 2016-08-15 DIAGNOSIS — I25708 Atherosclerosis of coronary artery bypass graft(s), unspecified, with other forms of angina pectoris: Secondary | ICD-10-CM

## 2016-08-20 ENCOUNTER — Other Ambulatory Visit: Payer: Medicare Other | Admitting: *Deleted

## 2016-08-20 ENCOUNTER — Ambulatory Visit (INDEPENDENT_AMBULATORY_CARE_PROVIDER_SITE_OTHER): Payer: Medicare Other | Admitting: Pharmacist

## 2016-08-20 DIAGNOSIS — I5043 Acute on chronic combined systolic (congestive) and diastolic (congestive) heart failure: Secondary | ICD-10-CM

## 2016-08-20 DIAGNOSIS — Z5181 Encounter for therapeutic drug level monitoring: Secondary | ICD-10-CM | POA: Insufficient documentation

## 2016-08-20 DIAGNOSIS — I25709 Atherosclerosis of coronary artery bypass graft(s), unspecified, with unspecified angina pectoris: Secondary | ICD-10-CM

## 2016-08-20 LAB — CBC
HEMATOCRIT: 43.2 % (ref 37.5–51.0)
Hemoglobin: 15.1 g/dL (ref 13.0–17.7)
MCH: 32.3 pg (ref 26.6–33.0)
MCHC: 35 g/dL (ref 31.5–35.7)
MCV: 92 fL (ref 79–97)
PLATELETS: 225 10*3/uL (ref 150–379)
RBC: 4.68 x10E6/uL (ref 4.14–5.80)
RDW: 14.4 % (ref 12.3–15.4)
WBC: 6.2 10*3/uL (ref 3.4–10.8)

## 2016-08-20 LAB — COMPREHENSIVE METABOLIC PANEL
ALK PHOS: 107 IU/L (ref 39–117)
ALT: 61 IU/L — AB (ref 0–44)
AST: 39 IU/L (ref 0–40)
Albumin/Globulin Ratio: 1.9 (ref 1.2–2.2)
Albumin: 4.5 g/dL (ref 3.5–4.7)
BILIRUBIN TOTAL: 0.7 mg/dL (ref 0.0–1.2)
BUN/Creatinine Ratio: 17 (ref 10–24)
BUN: 25 mg/dL (ref 8–27)
CHLORIDE: 104 mmol/L (ref 96–106)
CO2: 23 mmol/L (ref 20–29)
CREATININE: 1.43 mg/dL — AB (ref 0.76–1.27)
Calcium: 9.3 mg/dL (ref 8.6–10.2)
GFR calc Af Amer: 52 mL/min/{1.73_m2} — ABNORMAL LOW (ref >59)
GFR calc non Af Amer: 45 mL/min/{1.73_m2} — ABNORMAL LOW (ref >59)
GLUCOSE: 91 mg/dL (ref 65–99)
Globulin, Total: 2.4 g/dL (ref 1.5–4.5)
Potassium: 5.2 mmol/L (ref 3.5–5.2)
Sodium: 142 mmol/L (ref 134–144)
TOTAL PROTEIN: 6.9 g/dL (ref 6.0–8.5)

## 2016-08-20 LAB — DIGOXIN LEVEL

## 2016-08-20 LAB — PROTIME-INR
INR: 3.2 — ABNORMAL HIGH (ref 0.8–1.2)
PROTHROMBIN TIME: 31.3 s — AB (ref 9.1–12.0)

## 2016-08-20 LAB — POCT INR: INR: 3.8

## 2016-08-20 MED ORDER — ENOXAPARIN SODIUM 150 MG/ML ~~LOC~~ SOLN: 150.0000 mg | SUBCUTANEOUS | 0 refills | Status: DC

## 2016-08-20 NOTE — Patient Instructions (Addendum)
6/20: Last dose of Coumadin.  6/21: No Coumadin or Lovenox.  6/22: No Coumadin or Lovenox  6/23: Inject Lovenox in the fatty tissue at 11pm. No Coumadin.  6/24: Inject Lovenox in the fatty tissue at 11pm. No Coumadin.  6/25: No Lovenox. No Coumadin.  6/26: Procedure at 7:30am - No Lovenox - Resume Coumadin 5mg  in the evening or as directed by doctor.  6/27: Resume Lovenox inject in the fatty tissue at 8am and take Coumadin 5mg .  6/28: Inject Lovenox in the fatty tissue at 8am take Coumadin 5mg .  6/29: Inject Lovenox in the fatty tissue at 8am and take Coumadin 5mg .  6/30: Inject Lovenox in the fatty tissue at 8am and take Coumadin 5mg .  7/1: Inject Lovenox in the fatty tissue at 8am and take Coumadin 5mg .  7/2: Coumadin appt to check INR.

## 2016-08-25 ENCOUNTER — Telehealth: Payer: Self-pay

## 2016-08-25 NOTE — Telephone Encounter (Signed)
Patient contacted pre-catheterization at [facility] scheduled for: 08/26/2016 @ 0730 Verified arrival time and place: [facility] @ 0530 Confirmed AM meds to be taken pre-cath with sip of water: Advised Pt to take ASA.  Reminded Pt to hold warfarin and lasix.   Confirmed patient has responsible person to drive home post procedure and observe patient for 24 hours: wife and son Addl concerns:  Pt asked about sedation.  Informed Pt he would receive conscious sedation and procedure would be pain-free.  All Pt questions answered.  Pt appreciate of call.

## 2016-08-26 ENCOUNTER — Ambulatory Visit (HOSPITAL_COMMUNITY)
Admission: RE | Admit: 2016-08-26 | Discharge: 2016-08-26 | Disposition: A | Discharge status: Home / Self Care | Payer: Medicare Other | Source: Ambulatory Visit | Attending: Interventional Cardiology | Admitting: Interventional Cardiology

## 2016-08-26 ENCOUNTER — Encounter (HOSPITAL_COMMUNITY)
Admission: RE | Disposition: A | Discharge status: Home / Self Care | Payer: Self-pay | Source: Ambulatory Visit | Attending: Interventional Cardiology

## 2016-08-26 ENCOUNTER — Encounter (HOSPITAL_COMMUNITY): Payer: Self-pay | Admitting: Interventional Cardiology

## 2016-08-26 DIAGNOSIS — Z8673 Personal history of transient ischemic attack (TIA), and cerebral infarction without residual deficits: Secondary | ICD-10-CM | POA: Insufficient documentation

## 2016-08-26 DIAGNOSIS — M109 Gout, unspecified: Secondary | ICD-10-CM | POA: Diagnosis not present

## 2016-08-26 DIAGNOSIS — Z87891 Personal history of nicotine dependence: Secondary | ICD-10-CM | POA: Insufficient documentation

## 2016-08-26 DIAGNOSIS — I2510 Atherosclerotic heart disease of native coronary artery without angina pectoris: Secondary | ICD-10-CM | POA: Diagnosis not present

## 2016-08-26 DIAGNOSIS — I110 Hypertensive heart disease with heart failure: Secondary | ICD-10-CM | POA: Insufficient documentation

## 2016-08-26 DIAGNOSIS — I495 Sick sinus syndrome: Secondary | ICD-10-CM | POA: Diagnosis not present

## 2016-08-26 DIAGNOSIS — E039 Hypothyroidism, unspecified: Secondary | ICD-10-CM | POA: Diagnosis not present

## 2016-08-26 DIAGNOSIS — K219 Gastro-esophageal reflux disease without esophagitis: Secondary | ICD-10-CM | POA: Diagnosis not present

## 2016-08-26 DIAGNOSIS — I4891 Unspecified atrial fibrillation: Secondary | ICD-10-CM | POA: Diagnosis present

## 2016-08-26 DIAGNOSIS — Z7901 Long term (current) use of anticoagulants: Secondary | ICD-10-CM | POA: Diagnosis not present

## 2016-08-26 DIAGNOSIS — I5043 Acute on chronic combined systolic (congestive) and diastolic (congestive) heart failure: Secondary | ICD-10-CM | POA: Insufficient documentation

## 2016-08-26 DIAGNOSIS — I482 Chronic atrial fibrillation: Secondary | ICD-10-CM | POA: Insufficient documentation

## 2016-08-26 DIAGNOSIS — Z880 Allergy status to penicillin: Secondary | ICD-10-CM | POA: Diagnosis not present

## 2016-08-26 DIAGNOSIS — N400 Enlarged prostate without lower urinary tract symptoms: Secondary | ICD-10-CM | POA: Diagnosis not present

## 2016-08-26 DIAGNOSIS — I5042 Chronic combined systolic (congestive) and diastolic (congestive) heart failure: Secondary | ICD-10-CM | POA: Diagnosis present

## 2016-08-26 DIAGNOSIS — Z950 Presence of cardiac pacemaker: Secondary | ICD-10-CM | POA: Diagnosis not present

## 2016-08-26 DIAGNOSIS — E785 Hyperlipidemia, unspecified: Secondary | ICD-10-CM | POA: Insufficient documentation

## 2016-08-26 DIAGNOSIS — I25708 Atherosclerosis of coronary artery bypass graft(s), unspecified, with other forms of angina pectoris: Secondary | ICD-10-CM

## 2016-08-26 DIAGNOSIS — I2582 Chronic total occlusion of coronary artery: Secondary | ICD-10-CM | POA: Insufficient documentation

## 2016-08-26 DIAGNOSIS — I25709 Atherosclerosis of coronary artery bypass graft(s), unspecified, with unspecified angina pectoris: Secondary | ICD-10-CM | POA: Diagnosis present

## 2016-08-26 DIAGNOSIS — I639 Cerebral infarction, unspecified: Secondary | ICD-10-CM | POA: Diagnosis present

## 2016-08-26 DIAGNOSIS — Z951 Presence of aortocoronary bypass graft: Secondary | ICD-10-CM | POA: Insufficient documentation

## 2016-08-26 HISTORY — PX: RIGHT/LEFT HEART CATH AND CORONARY/GRAFT ANGIOGRAPHY: CATH118267

## 2016-08-26 LAB — POCT I-STAT 3, VENOUS BLOOD GAS (G3P V)
ACID-BASE EXCESS: 1 mmol/L (ref 0.0–2.0)
Bicarbonate: 27.1 mmol/L (ref 20.0–28.0)
O2 SAT: 69 %
TCO2: 28 mmol/L (ref 0–100)
pCO2, Ven: 46.1 mmHg (ref 44.0–60.0)
pH, Ven: 7.377 (ref 7.250–7.430)
pO2, Ven: 37 mmHg (ref 32.0–45.0)

## 2016-08-26 LAB — POCT I-STAT 3, ART BLOOD GAS (G3+)
Acid-Base Excess: 1 mmol/L (ref 0.0–2.0)
Bicarbonate: 26.9 mmol/L (ref 20.0–28.0)
O2 Saturation: 95 %
PH ART: 7.368 (ref 7.350–7.450)
TCO2: 28 mmol/L (ref 0–100)
pCO2 arterial: 46.7 mmHg (ref 32.0–48.0)
pO2, Arterial: 80 mmHg — ABNORMAL LOW (ref 83.0–108.0)

## 2016-08-26 LAB — PROTIME-INR
INR: 1.15
PROTHROMBIN TIME: 14.8 s (ref 11.4–15.2)

## 2016-08-26 LAB — POCT ACTIVATED CLOTTING TIME: Activated Clotting Time: 164 seconds

## 2016-08-26 SURGERY — RIGHT/LEFT HEART CATH AND CORONARY/GRAFT ANGIOGRAPHY
Anesthesia: LOCAL

## 2016-08-26 MED ORDER — ACETAMINOPHEN 325 MG PO TABS: 650.0000 mg | ORAL_TABLET | ORAL | Status: DC | PRN

## 2016-08-26 MED ORDER — ATORVASTATIN CALCIUM 20 MG PO TABS: 20.0000 mg | ORAL_TABLET | Freq: Every day | ORAL | Status: DC

## 2016-08-26 MED ORDER — MIDAZOLAM HCL 2 MG/2ML IJ SOLN
INTRAMUSCULAR | Status: DC | PRN
  Administered 2016-08-26 (×2): 1 mg via INTRAVENOUS

## 2016-08-26 MED ORDER — IOPAMIDOL (ISOVUE-370) INJECTION 76%
INTRAVENOUS | Status: AC
  Filled 2016-08-26: qty 125

## 2016-08-26 MED ORDER — SODIUM CHLORIDE 0.9 % IV SOLN
250.0000 mL | INTRAVENOUS | Status: DC | PRN
Start: 2016-08-26 — End: 2016-08-26

## 2016-08-26 MED ORDER — SODIUM CHLORIDE 0.9% FLUSH: 3.0000 mL | INTRAVENOUS | Status: DC | PRN

## 2016-08-26 MED ORDER — VERAPAMIL HCL 2.5 MG/ML IV SOLN
INTRAVENOUS | Status: DC | PRN
  Administered 2016-08-26: 10 mL via INTRA_ARTERIAL

## 2016-08-26 MED ORDER — HEPARIN SODIUM (PORCINE) 1000 UNIT/ML IJ SOLN
INTRAMUSCULAR | Status: DC | PRN
  Administered 2016-08-26: 5000 [IU] via INTRAVENOUS

## 2016-08-26 MED ORDER — SODIUM CHLORIDE 0.9 % IV SOLN: 250.0000 mL | INTRAVENOUS | Status: DC | PRN

## 2016-08-26 MED ORDER — HEPARIN (PORCINE) IN NACL 2-0.9 UNIT/ML-% IJ SOLN
INTRAMUSCULAR | Status: AC
  Filled 2016-08-26: qty 1000

## 2016-08-26 MED ORDER — LIDOCAINE HCL (PF) 1 % IJ SOLN
INTRAMUSCULAR | Status: DC | PRN
  Administered 2016-08-26: 1 mL via INTRADERMAL
  Administered 2016-08-26: 17 mL via INTRADERMAL
  Administered 2016-08-26: 1 mL via INTRADERMAL

## 2016-08-26 MED ORDER — IOPAMIDOL (ISOVUE-370) INJECTION 76%
INTRAVENOUS | Status: AC
  Filled 2016-08-26: qty 50

## 2016-08-26 MED ORDER — COLCHICINE 0.6 MG PO TABS: 0.6000 mg | ORAL_TABLET | Freq: Every day | ORAL | Status: DC

## 2016-08-26 MED ORDER — DILTIAZEM HCL ER COATED BEADS 180 MG PO CP24: 180.0000 mg | ORAL_CAPSULE | Freq: Every day | ORAL | Status: DC

## 2016-08-26 MED ORDER — METOPROLOL SUCCINATE ER 50 MG PO TB24: 50.0000 mg | ORAL_TABLET | Freq: Two times a day (BID) | ORAL | Status: DC

## 2016-08-26 MED ORDER — OXYCODONE-ACETAMINOPHEN 5-325 MG PO TABS: 1.0000 | ORAL_TABLET | ORAL | Status: DC | PRN

## 2016-08-26 MED ORDER — FENTANYL CITRATE (PF) 100 MCG/2ML IJ SOLN
INTRAMUSCULAR | Status: DC | PRN
  Administered 2016-08-26: 50 ug via INTRAVENOUS

## 2016-08-26 MED ORDER — LEVOTHYROXINE SODIUM 112 MCG PO TABS: 112.0000 ug | ORAL_TABLET | Freq: Every day | ORAL | Status: DC

## 2016-08-26 MED ORDER — DIGOXIN 125 MCG PO TABS: 0.1250 mg | ORAL_TABLET | ORAL | Status: DC

## 2016-08-26 MED ORDER — SODIUM CHLORIDE 0.9 % IV SOLN
INTRAVENOUS | Status: DC
  Administered 2016-08-26: 06:00:00 via INTRAVENOUS

## 2016-08-26 MED ORDER — POTASSIUM CHLORIDE CRYS ER 20 MEQ PO TBCR: 20.0000 meq | EXTENDED_RELEASE_TABLET | Freq: Every day | ORAL | Status: DC

## 2016-08-26 MED ORDER — WARFARIN SODIUM 5 MG PO TABS: 5.0000 mg | ORAL_TABLET | Freq: Every day | ORAL | Status: DC

## 2016-08-26 MED ORDER — VERAPAMIL HCL 2.5 MG/ML IV SOLN
INTRAVENOUS | Status: AC
  Filled 2016-08-26: qty 2

## 2016-08-26 MED ORDER — MAGNESIUM 250 MG PO TABS: 250.0000 mg | ORAL_TABLET | Freq: Every day | ORAL | Status: DC

## 2016-08-26 MED ORDER — ONDANSETRON HCL 4 MG/2ML IJ SOLN: 4.0000 mg | Freq: Four times a day (QID) | INTRAMUSCULAR | Status: DC | PRN

## 2016-08-26 MED ORDER — SODIUM CHLORIDE 0.9 % IV SOLN: INTRAVENOUS | Status: AC

## 2016-08-26 MED ORDER — TAMSULOSIN HCL 0.4 MG PO CAPS: 0.4000 mg | ORAL_CAPSULE | Freq: Every evening | ORAL | Status: DC

## 2016-08-26 MED ORDER — VITAMIN D3 25 MCG (1000 UT) PO CAPS: 1000.0000 [IU] | ORAL_CAPSULE | Freq: Every day | ORAL | Status: DC

## 2016-08-26 MED ORDER — SODIUM CHLORIDE 0.9% FLUSH: 3.0000 mL | Freq: Two times a day (BID) | INTRAVENOUS | Status: DC

## 2016-08-26 MED ORDER — HEPARIN (PORCINE) IN NACL 2-0.9 UNIT/ML-% IJ SOLN
INTRAMUSCULAR | Status: AC | PRN
  Administered 2016-08-26: 1000 mL

## 2016-08-26 MED ORDER — ENOXAPARIN SODIUM 150 MG/ML ~~LOC~~ SOLN: 150.0000 mg | SUBCUTANEOUS | Status: DC

## 2016-08-26 MED ORDER — MIDAZOLAM HCL 2 MG/2ML IJ SOLN
INTRAMUSCULAR | Status: AC
  Filled 2016-08-26: qty 2

## 2016-08-26 MED ORDER — ASPIRIN 81 MG PO CHEW: 81.0000 mg | CHEWABLE_TABLET | ORAL | Status: DC

## 2016-08-26 MED ORDER — LIDOCAINE HCL 1 % IJ SOLN
INTRAMUSCULAR | Status: AC
Start: 2016-08-26 — End: 2016-08-26
  Filled 2016-08-26: qty 20

## 2016-08-26 MED ORDER — FENTANYL CITRATE (PF) 100 MCG/2ML IJ SOLN
INTRAMUSCULAR | Status: AC
  Filled 2016-08-26: qty 2

## 2016-08-26 MED ORDER — ALLOPURINOL 100 MG PO TABS: 100.0000 mg | ORAL_TABLET | Freq: Every day | ORAL | Status: DC

## 2016-08-26 MED ORDER — IOPAMIDOL (ISOVUE-370) INJECTION 76%
INTRAVENOUS | Status: DC | PRN
  Administered 2016-08-26: 180 mL via INTRAVENOUS

## 2016-08-26 MED ORDER — FUROSEMIDE 40 MG PO TABS: 40.0000 mg | ORAL_TABLET | Freq: Every day | ORAL | Status: DC

## 2016-08-26 SURGICAL SUPPLY — 16 items
CATH BALLN WEDGE 5F 110CM (CATHETERS) ×1 IMPLANT
CATH EXPO 5FR FR4 (CATHETERS) ×1 IMPLANT
CATH INFINITI 5FR JL4 (CATHETERS) ×1 IMPLANT
CATH INFINITI 5FR MPB2 (CATHETERS) ×1 IMPLANT
DEVICE RAD COMP TR BAND LRG (VASCULAR PRODUCTS) ×1 IMPLANT
GLIDESHEATH SLEND A-KIT 6F 22G (SHEATH) ×1 IMPLANT
GUIDEWIRE INQWIRE 1.5J.035X260 (WIRE) IMPLANT
INQWIRE 1.5J .035X260CM (WIRE) ×2
KIT HEART LEFT (KITS) ×2 IMPLANT
PACK CARDIAC CATHETERIZATION (CUSTOM PROCEDURE TRAY) ×2 IMPLANT
SHEATH GLIDE SLENDER 4/5FR (SHEATH) ×1 IMPLANT
SHEATH PINNACLE 5F 10CM (SHEATH) ×1 IMPLANT
TRANSDUCER W/STOPCOCK (MISCELLANEOUS) ×2 IMPLANT
TUBING CIL FLEX 10 FLL-RA (TUBING) ×2 IMPLANT
WIRE EMERALD 3MM-J .035X150CM (WIRE) ×1 IMPLANT
WIRE HI TORQ VERSACORE-J 145CM (WIRE) ×1 IMPLANT

## 2016-08-26 NOTE — Interval H&P Note (Signed)
Cath Lab Visit (complete for each Cath Lab visit)  Clinical Evaluation Leading to the Procedure:   ACS: No.  Non-ACS:    Anginal Classification: CCS III  Anti-ischemic medical therapy: Minimal Therapy (1 class of medications)  Non-Invasive Test Results: No non-invasive testing performed  Prior CABG: Previous CABG      History and Physical Interval Note:  08/26/2016 7:33 AM  Timothy Harper  has presented today for surgery, with the diagnosis of cad - hf  The various methods of treatment have been discussed with the patient and family. After consideration of risks, benefits and other options for treatment, the patient has consented to  Procedure(s): Right/Left Heart Cath and Coronary/Graft Angiography (N/A) as a surgical intervention .  The patient's history has been reviewed, patient examined, no change in status, stable for surgery.  I have reviewed the patient's chart and labs.  Questions were answered to the patient's satisfaction.     Belva Crome III

## 2016-08-26 NOTE — Progress Notes (Signed)
Site area: rt groin fa sheath Site Prior to Removal:  Level 0 Pressure Applied For: 20 minutes Manual:   yes Patient Status During Pull:  stable Post Pull Site:  Level 0 Post Pull Instructions Given:  yes Post Pull Pulses Present: palpable Dressing Applied:  Gauze and tegaderm Bedrest begins @ 0938 Comments:

## 2016-08-26 NOTE — Progress Notes (Signed)
Site area: rt ac venous sheath Site Prior to Removal:  Level 0 Pressure Applied For: 10 minutes Manual:   yes Patient Status During Pull:  stable Post Pull Site:  Level 0 Post Pull Instructions Given:  yes Post Pull Pulses Present: palpable radial Dressing Applied:  Gauze and tegaderm Bedrest begins @  Comments:   

## 2016-08-26 NOTE — H&P (View-Only) (Signed)
Cardiology Office Note    Date:  08/13/2016   ID:  HARUKI ARNOLD, DOB 01-15-1933, MRN [id number]  PCP:  Lavone Orn, MD  Cardiologist: Sinclair Grooms, MD   Chief Complaint  Patient presents with  . chronic atrial fibrillation    follow up    History of Present Illness:  Timothy Harper is a 83 y.o. male with history of coronary artery disease with prior coronary bypass grafting and native vessel PCI, tachybradycardia syndrome with atrial fibrillation, hypertension, embolic CVA, and chronic anticoagulation therapy.  Overall he feels better than in early May when he had significant orthopnea. Evaluation identified LVEF 30-35%. This occurred in the absence of chest discomfort. It is very difficult to get a history from [place]. He has not used nitroglycerin. He was having orthopnea which is been the most part improved. He still has mild orthopnea.  He was diagnosed with right lower extremity gout 48 hours ago. He is now on cultures seen and kETROLAC. The right foot is improving.  Past Medical History:  Diagnosis Date  . 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 Atrial fibrillation ([place])   . BPH (benign prostatic hyperplasia)   . Cardiac pacemaker in situ   . Chronic atrial fibrillation ([place])   . Coronary artery disease involving coronary bypass graft 1994   LIMA to RCA . Prior PCI on RCA with restenosis  . CVA (cerebral infarction) 1995  . CVA (cerebral infarction)    Right  . Erectile dysfunction   . GERD (gastroesophageal reflux disease)   . Hearing loss    hearing aids  . Herpes zoster   . HTN (hypertension)   . Hyperlipemia   . Hypothyroidism   . Long term (current) use of anticoagulants   . Melanoma [facility])    right ear, Rosana Hoes 2002 and left arm 1997, [place]  . Primary male hypogonadism     No past surgical history on file.  Current Medications: Outpatient Medications Prior to Visit  Medication Sig Dispense Refill  .  atorvastatin (LIPITOR) 20 MG tablet Take 20 mg by mouth daily.    . DENTA 5000 PLUS 1.1 % CREA dental cream Apply 1 application topically 2 (two) times daily.    . digoxin (DIGOX) 0.125 MG tablet Take 0.125 mg by mouth 3 (three) times a week.     . diltiazem (TIAZAC) 180 MG 24 hr capsule Take 180 mg by mouth daily.    . furosemide (LASIX) 40 MG tablet Take 1 tablet (40 mg total) by mouth daily. 90 tablet 3  . levothyroxine (SYNTHROID, LEVOTHROID) 112 MCG tablet Take 112 mcg by mouth daily before breakfast.    . Magnesium Oxide (MAG-200) 200 MG TABS Take 200 mg by mouth as directed.     . metoprolol succinate (TOPROL-XL) 50 MG 24 hr tablet Take 1 tablet (50 mg total) by mouth 2 (two) times daily. 60 tablet 11  . omeprazole (PRILOSEC) 20 MG capsule Take 20 mg by mouth as directed.     . potassium chloride SA (K-DUR,KLOR-CON) 20 MEQ tablet Take 1 tablet (20 mEq total) by mouth daily. 90 tablet 3  . sildenafil (VIAGRA) 100 MG tablet Take 100 mg by mouth daily as needed for erectile dysfunction.    . tamsulosin (FLOMAX) 0.4 MG CAPS capsule Take 0.4 mg by mouth daily.    . Testosterone (FORTESTA) 10 MG/ACT (2%) GEL Place onto the skin. 4 pumps daily    .  vardenafil (LEVITRA) 20 MG tablet Take 20 mg by mouth daily as needed for erectile dysfunction.    Marland Kitchen warfarin (COUMADIN) 5 MG tablet Take 5 mg by mouth as directed.      No facility-administered medications prior to visit.      Allergies:   Penicillins and Prednisone   Social History   Social History  . Marital status: Married    Spouse name: N/A  . Number of children: 4  . Years of education: N/A   Occupational History  . financial advisor     retired   Social History Main Topics  . Smoking status: Former Smoker    Quit date: 08/01/1988  . Smokeless tobacco: Never Used  . Alcohol use Yes     Comment: 2-3 times a week  . Drug use: No  . Sexual activity: Not Asked   Other Topics Concern  . None   Social History Narrative  . None       Family History:  The patient's family history includes Heart disease in his mother.   ROS:   Please see the history of present illness.    Difficulty lying. History of CVA 3. On chronic anticoagulation therapy because of atrial fibrillation.  All other systems reviewed and are negative.   PHYSICAL EXAM:   VS:  BP 100/68   Pulse 84   Ht 5\' 11"  (1.803 m)   Wt 225 lb 6.4 oz (102.2 kg)   BMI 31.44 kg/m    GEN: Well nourished, well developed, in no acute distress  HEENT: normal  Neck: no JVD, carotid bruits, or masses Cardiac: IIRR; no murmurs, rubs, or gallops,no edema  Respiratory:  clear to auscultation bilaterally, normal work of breathing GI: soft, nontender, nondistended, + BS MS: no deformity or atrophy  Skin: warm and dry, no rash Neuro:  Alert and Oriented x 3, Strength and sensation are intact Psych: euthymic mood, full affect  Wt Readings from Last 3 Encounters:  08/13/16 225 lb 6.4 oz (102.2 kg)  07/16/16 224 lb 12.8 oz (102 kg)  07/09/16 227 lb 3.2 oz (103.1 kg)      Studies/Labs Reviewed:   EKG:  EKG  Not repeated.  Recent Labs: 07/09/2016: ALT 19; Hemoglobin 14.7; NT-Pro BNP 1,025; Platelets 214; TSH 2.800 07/29/2016: BUN 23; Creatinine, Ser 1.30; Potassium 4.3; Sodium 141   Lipid Panel No results found for: CHOL, TRIG, HDL, CHOLHDL, VLDL, LDLCALC, LDLDIRECT  Additional studies/ records that were reviewed today include:   Echocardiogram May 2018 Study Conclusions   - Left ventricle: The cavity size was normal. Wall thickness was   normal. Systolic function was moderately to severely reduced. The   estimated ejection fraction was in the range of 30% to 35%.   Diffuse hypokinesis. - Mitral valve: There was mild to moderate regurgitation. - Left atrium: The atrium was severely dilated. - Right ventricle: The cavity size was moderately dilated. - Right atrium: The atrium was moderately dilated.      ASSESSMENT:    1. Coronary artery disease  involving coronary bypass graft of native heart with angina pectoris ([place])   2. Chronic atrial fibrillation (HCC)   3. Acute on chronic combined systolic and diastolic CHF (congestive heart failure) ([place])   4. Essential hypertension   5. Sinus node dysfunction (HCC)      PLAN:  In order of problems listed above:  56. 83 year old bypass grafts. No reevaluation recently. Now with new systolic heart failure of uncertain etiology. Needs coronary angiography  to rule out silent graft occlusion and resultant ischemic cardiomyopathy do to graft failure. Procedure and risks were discussed with the patient. 2. 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 We will have him followed in our Coumadin clinic in preparation for cath. 3. Volume status is improved. Orthopnea is improved. Continue current dose of furosemide. Blood work will be done soon for the upcoming cardiac cath. 4. Low normal on current medical regimen. No change.  The patient was counseled to undergo left and right heart catheterization, coronary and bypass graft angiography, and possible percutaneous coronary intervention with stent implantation. The procedural risks and benefits were discussed in detail. The risks discussed included death, stroke, myocardial infarction, life-threatening bleeding, limb ischemia, kidney injury, allergy, and possible emergency cardiac surgery. The risk of these significant complications were estimated to occur less than 1% of the time. After discussion, the patient has agreed to proceed.    Medication Adjustments/Labs and Tests Ordered: Current medicines are reviewed at length with the patient today.  Concerns regarding medicines are outlined above.  Medication changes, Labs and Tests ordered today are listed in the Patient Instructions below. There are no Patient Instructions on file for this visit.   Signed, Sinclair Grooms,  MD  08/13/2016 12:26 PM    [address], [place],   [address] Phone: [phone]; Fax: [phone]

## 2016-08-26 NOTE — Discharge Instructions (Signed)
RESUME COUMADIN TONIGHT AS INSTRUCTED BY PHARMACIST. RESUME LOVENOX TOMORROW AS INSTRUCTED BY PHARMACIST.   Femoral Site Care Refer to this sheet in the next few weeks. These instructions provide you with information about caring for yourself after your procedure. Your health care provider may also give you more specific instructions. Your treatment has been planned according to current medical practices, but problems sometimes occur. Call your health care provider if you have any problems or questions after your procedure. What can I expect after the procedure? After your procedure, it is typical to have the following:  Bruising at the site that usually fades within 1-2 weeks.  Blood collecting in the tissue (hematoma) that may be painful to the touch. It should usually decrease in size and tenderness within 1-2 weeks.  Follow these instructions at home:  Take medicines only as directed by your health care provider.  You may shower 24-48 hours after the procedure or as directed by your health care provider. Remove the bandage (dressing) and gently wash the site with plain soap and water. Pat the area dry with a clean towel. Do not rub the site, because this may cause bleeding.  Do not take baths, swim, or use a hot tub until your health care provider approves.  Check your insertion site every day for redness, swelling, or drainage.  Do not apply powder or lotion to the site.  Limit use of stairs to twice a day for the first 2-3 days or as directed by your health care provider.  Do not squat for the first 2-3 days or as directed by your health care provider.  Do not lift over 10 lb (4.5 kg) for 5 days after your procedure or as directed by your health care provider.  Ask your health care provider when it is okay to: ? Return to work or school. ? Resume usual physical activities or sports. ? Resume sexual activity.  Do not drive home if you are discharged the same day as the  procedure. Have someone else drive you.  You may drive 24 hours after the procedure unless otherwise instructed by your health care provider.  Do not operate machinery or power tools for 24 hours after the procedure or as directed by your health care provider.  If your procedure was done as an outpatient procedure, which means that you went home the same day as your procedure, a responsible adult should be with you for the first 24 hours after you arrive home.  Keep all follow-up visits as directed by your health care provider. This is important. Contact a health care provider if:  You have a fever.  You have chills.  You have increased bleeding from the site. Hold pressure on the site. Get help right away if:  You have unusual pain at the site.  You have redness, warmth, or swelling at the site.  You have drainage (other than a small amount of blood on the dressing) from the site.  The site is bleeding, and the bleeding does not stop after 30 minutes of holding steady pressure on the site.  Your leg or foot becomes pale, cool, tingly, or numb. This information is not intended to replace advice given to you by your health care provider. Make sure you discuss any questions you have with your health care provider. Document Released: 10/21/2013 Document Revised: 07/26/2015 Document Reviewed: 09/06/2013 Elsevier Interactive Patient Education  2018 [place] Refer to this sheet in  the next few weeks. These instructions provide you with information about caring for yourself after your procedure. Your health care provider may also give you more specific instructions. Your treatment has been planned according to current medical practices, but problems sometimes occur. Call your health care provider if you have any problems or questions after your procedure. What can I expect after the procedure? After your procedure, it is typical to have the  following:  Bruising at the radial site that usually fades within 1-2 weeks.  Blood collecting in the tissue (hematoma) that may be painful to the touch. It should usually decrease in size and tenderness within 1-2 weeks.  Follow these instructions at home:  Take medicines only as directed by your health care provider.  You may shower 24-48 hours after the procedure or as directed by your health care provider. Remove the bandage (dressing) and gently wash the site with plain soap and water. Pat the area dry with a clean towel. Do not rub the site, because this may cause bleeding.  Do not take baths, swim, or use a hot tub until your health care provider approves.  Check your insertion site every day for redness, swelling, or drainage.  Do not apply powder or lotion to the site.  Do not flex or bend the affected arm for 24 hours or as directed by your health care provider.  Do not push or pull heavy objects with the affected arm for 24 hours or as directed by your health care provider.  Do not lift over 10 lb (4.5 kg) for 5 days after your procedure or as directed by your health care provider.  Ask your health care provider when it is okay to: ? Return to work or school. ? Resume usual physical activities or sports. ? Resume sexual activity.  Do not drive home if you are discharged the same day as the procedure. Have someone else drive you.  You may drive 24 hours after the procedure unless otherwise instructed by your health care provider.  Do not operate machinery or power tools for 24 hours after the procedure.  If your procedure was done as an outpatient procedure, which means that you went home the same day as your procedure, a responsible adult should be with you for the first 24 hours after you arrive home.  Keep all follow-up visits as directed by your health care provider. This is important. Contact a health care provider if:  You have a fever.  You have  chills.  You have increased bleeding from the radial site. Hold pressure on the site. Get help right away if:  You have unusual pain at the radial site.  You have redness, warmth, or swelling at the radial site.  You have drainage (other than a small amount of blood on the dressing) from the radial site.  The radial site is bleeding, and the bleeding does not stop after 30 minutes of holding steady pressure on the site.  Your arm or hand becomes pale, cool, tingly, or numb. This information is not intended to replace advice given to you by your health care provider. Make sure you discuss any questions you have with your health care provider. Document Released: 03/22/2010 Document Revised: 07/26/2015 Document Reviewed: 09/05/2013 Elsevier Interactive Patient Education  2018 [employer].

## 2016-08-27 ENCOUNTER — Telehealth: Payer: Self-pay | Admitting: Interventional Cardiology

## 2016-08-27 DIAGNOSIS — I5043 Acute on chronic combined systolic (congestive) and diastolic (congestive) heart failure: Secondary | ICD-10-CM

## 2016-08-27 MED ORDER — LISINOPRIL 10 MG PO TABS: 10.0000 mg | ORAL_TABLET | Freq: Every day | ORAL | 3 refills | Status: DC

## 2016-08-27 NOTE — Telephone Encounter (Signed)
Per Dr. Tamala Julian- pt needs to be started on Lisinopril 10mg  QD, BMET in 1 wk and OV in 1 mo.  Spoke with pt and went over recommendations.  Scheduled labs for 09/04/16 and pt was already scheduled for OV 7/18.  Pt verbalized understanding and was in agreement with this plan.

## 2016-08-28 ENCOUNTER — Telehealth: Payer: Self-pay | Admitting: Interventional Cardiology

## 2016-08-28 NOTE — Telephone Encounter (Signed)
Spoke with pt and he states that he is having some cramping in his right leg and ankle.  This is intermittent.  Denies swelling.  Advised pt we prefer not to lower diuretics d/t CHF and his K+ is high normal so cramping is not coming from K+.  Spoke with pt about fluid intake and he states that doesn't drink a lot.  Will drink a cup of coffee in the morning and then will go about 8 hrs and not drink anything.  Then drinks some water in the evening.  Advised pt he should be taking in 1.5-2L/day.  Pt states he is not taking in that much at all.  Advised pt cramping could be coming from slight dehydration since his fluid intake is very low.  Pt agreeable to increase fluids.  Advised if cramping continues to contact PCP because no cardiac reason for this.  Pt agreeable. Spoke with Dr. Tamala Julian and clarified fluid amount and he said 1.5-2L/day is correct.

## 2016-08-28 NOTE — Telephone Encounter (Signed)
°  New Prob  Pt reports ongoing cramping to R leg and R ankle. Requesting to speak to nurse about decreasing fluid pill dose. Please call.

## 2016-09-01 ENCOUNTER — Ambulatory Visit (INDEPENDENT_AMBULATORY_CARE_PROVIDER_SITE_OTHER): Payer: Medicare Other | Admitting: *Deleted

## 2016-09-01 DIAGNOSIS — Z5181 Encounter for therapeutic drug level monitoring: Secondary | ICD-10-CM | POA: Diagnosis not present

## 2016-09-01 DIAGNOSIS — I4891 Unspecified atrial fibrillation: Secondary | ICD-10-CM | POA: Diagnosis not present

## 2016-09-01 LAB — POCT INR: INR: 1.3

## 2016-09-02 ENCOUNTER — Encounter: Payer: Self-pay | Admitting: *Deleted

## 2016-09-04 ENCOUNTER — Other Ambulatory Visit: Payer: Medicare Other

## 2016-09-05 ENCOUNTER — Ambulatory Visit (INDEPENDENT_AMBULATORY_CARE_PROVIDER_SITE_OTHER): Payer: Medicare Other | Admitting: Pharmacist

## 2016-09-05 ENCOUNTER — Other Ambulatory Visit: Payer: Medicare Other | Admitting: *Deleted

## 2016-09-05 DIAGNOSIS — I4891 Unspecified atrial fibrillation: Secondary | ICD-10-CM | POA: Diagnosis not present

## 2016-09-05 DIAGNOSIS — Z5181 Encounter for therapeutic drug level monitoring: Secondary | ICD-10-CM | POA: Diagnosis not present

## 2016-09-05 DIAGNOSIS — I5043 Acute on chronic combined systolic (congestive) and diastolic (congestive) heart failure: Secondary | ICD-10-CM

## 2016-09-05 LAB — BASIC METABOLIC PANEL
BUN / CREAT RATIO: 16 (ref 10–24)
BUN: 21 mg/dL (ref 8–27)
CALCIUM: 9.7 mg/dL (ref 8.6–10.2)
CHLORIDE: 98 mmol/L (ref 96–106)
CO2: 21 mmol/L (ref 20–29)
Creatinine, Ser: 1.35 mg/dL — ABNORMAL HIGH (ref 0.76–1.27)
GFR calc non Af Amer: 49 mL/min/{1.73_m2} — ABNORMAL LOW (ref >59)
GFR, EST AFRICAN AMERICAN: 56 mL/min/{1.73_m2} — AB (ref >59)
Glucose: 104 mg/dL — ABNORMAL HIGH (ref 65–99)
POTASSIUM: 5 mmol/L (ref 3.5–5.2)
SODIUM: 136 mmol/L (ref 134–144)

## 2016-09-05 LAB — POCT INR: INR: 2

## 2016-09-16 NOTE — Progress Notes (Signed)
Cardiology Office Note    Date:  09/17/2016   ID:  Timothy Harper, DOB Jul 09, 1932, MRN [id number]  PCP:  Lavone Orn, MD  Cardiologist: Sinclair Grooms, MD   Chief Complaint  Patient presents with  . Atrial Fibrillation  . Congestive Heart Failure    History of Present Illness:  Timothy Harper is a 83 y.o. male with history of coronary artery disease with prior coronary bypass grafting and native vessel PCI, tachybradycardia syndrome with atrial fibrillation, hypertension, embolic CVA, and chronic anticoagulation therapy.  He is able to lie in bed without shortness of breath. We discussed results of his heart catheterization. He denies any side effects to medications but is aggravated by frequent urination. He has not had syncope.  Past Medical History:  Diagnosis Date  . 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 Atrial fibrillation ([place])   . BPH (benign prostatic hyperplasia)   . Cardiac pacemaker in situ   . Chronic atrial fibrillation ([place])   . Coronary artery disease involving coronary bypass graft 1994   LIMA to RCA . Prior PCI on RCA with restenosis  . CVA (cerebral infarction) 1995  . CVA (cerebral infarction)    Right  . Erectile dysfunction   . GERD (gastroesophageal reflux disease)   . Hearing loss    hearing aids  . Herpes zoster   . HTN (hypertension)   . Hyperlipemia   . Hypothyroidism   . Long term (current) use of anticoagulants   . Melanoma [facility])    right ear, Rosana Hoes 2002 and left arm 1997, [place]  . Primary male hypogonadism     Past Surgical History:  Procedure Laterality Date  . RIGHT/LEFT HEART CATH AND CORONARY/GRAFT ANGIOGRAPHY N/A 08/26/2016   Procedure: Right/Left Heart Cath and Coronary/Graft Angiography;  Surgeon: Belva Crome, MD;  Location: [facility];  Service: Cardiovascular;  Laterality: N/A;    Current Medications: Outpatient Medications Prior to Visit  Medication Sig Dispense Refill  .  acetaminophen (TYLENOL) 500 MG tablet Take 1,000 mg by mouth 2 (two) times daily as needed for moderate pain or headache.    Marland Kitchen aspirin 325 MG tablet Take 325 mg by mouth as needed.    Marland Kitchen aspirin-sod bicarb-citric acid (ALKA-SELTZER) 325 MG TBEF tablet Take 325 mg by mouth daily as needed (acid reflux).    Marland Kitchen atorvastatin (LIPITOR) 20 MG tablet Take 20 mg by mouth at bedtime.     . Cholecalciferol (VITAMIN D3) 1000 units CAPS Take 1,000 Units by mouth daily.    . DENTA 5000 PLUS 1.1 % CREA dental cream Apply 1 application topically at bedtime.     . digoxin (DIGOX) 0.125 MG tablet Take 0.125 mg by mouth every Monday, Wednesday, and Friday.     . diltiazem (CARDIZEM CD) 180 MG 24 hr capsule Take 180 mg by mouth daily.    Marland Kitchen enoxaparin (LOVENOX) 150 MG/ML injection Inject 1 mL (150 mg total) into the skin daily. 10 Syringe 0  . furosemide (LASIX) 40 MG tablet Take 1 tablet (40 mg total) by mouth daily. 90 tablet 3  . levothyroxine (SYNTHROID, LEVOTHROID) 112 MCG tablet Take 112 mcg by mouth daily before breakfast.    . lisinopril (PRINIVIL,ZESTRIL) 10 MG tablet Take 1 tablet (10 mg total) by mouth daily. 90 tablet 3  . Magnesium 250 MG TABS Take 250 mg by mouth daily.    . metoprolol succinate (TOPROL-XL) 50 MG 24 hr  tablet Take 1 tablet (50 mg total) by mouth 2 (two) times daily. 60 tablet 11  . potassium chloride SA (K-DUR,KLOR-CON) 20 MEQ tablet Take 1 tablet (20 mEq total) by mouth daily. 90 tablet 3  . sildenafil (VIAGRA) 100 MG tablet Take 100 mg by mouth daily as needed for erectile dysfunction.    . tamsulosin (FLOMAX) 0.4 MG CAPS capsule Take 0.4 mg by mouth every evening.     . Testosterone (FORTESTA) 10 MG/ACT (2%) GEL Place onto the skin. 4 pumps daily    . vardenafil (LEVITRA) 20 MG tablet Take 20 mg by mouth daily as needed for erectile dysfunction.    Marland Kitchen warfarin (COUMADIN) 5 MG tablet Take 5 mg by mouth daily. Take 5mg s by mouth daily except fridays dont take any warfarin    .  allopurinol (ZYLOPRIM) 100 MG tablet Take 100 mg by mouth daily.    . colchicine 0.6 MG tablet Take 0.6 mg by mouth daily.      No facility-administered medications prior to visit.      Allergies:   Penicillins and Prednisone   Social History   Social History  . Marital status: Married    Spouse name: N/A  . Number of children: 4  . Years of education: N/A   Occupational History  . financial advisor     retired   Social History Main Topics  . Smoking status: Former Smoker    Quit date: 08/01/1988  . Smokeless tobacco: Never Used  . Alcohol use Yes     Comment: 2-3 times a week  . Drug use: No  . Sexual activity: Not Asked   Other Topics Concern  . None   Social History Narrative  . None     Family History:  The patient's family history includes Heart disease in his mother.   ROS:   Please see the history of present illness.    Frequent urination, easy bruising, rash, occasional orthopnea.  All other systems reviewed and are negative.   PHYSICAL EXAM:   VS:  BP 94/70 (BP Location: Right Arm)   Pulse 82   Ht 5\' 11"  (1.803 m)   Wt 218 lb 6.4 oz (99.1 kg)   BMI 30.46 kg/m    GEN: Well nourished, well developed, in no acute distress  HEENT: normal  Neck: no JVD, carotid bruits, or masses Cardiac: IIRR; no murmurs, rubs, or gallops,no edema  Respiratory:  clear to auscultation bilaterally, normal work of breathing GI: soft, nontender, nondistended, + BS MS: no deformity or atrophy  Skin: warm and dry, no rash Neuro:  Alert and Oriented x 3, Strength and sensation are intact Psych: euthymic mood, full affect  Wt Readings from Last 3 Encounters:  09/17/16 218 lb 6.4 oz (99.1 kg)  08/26/16 224 lb (101.6 kg)  08/13/16 225 lb 6.4 oz (102.2 kg)      Studies/Labs Reviewed:   EKG:  EKG  Not performed  Recent Labs: 07/09/2016: NT-Pro BNP 1,025; TSH 2.800 08/20/2016: ALT 61; Hemoglobin 15.1; Platelets 225 09/05/2016: BUN 21; Creatinine, Ser 1.35; Potassium 5.0;  Sodium 136   Lipid Panel No results found for: CHOL, TRIG, HDL, CHOLHDL, VLDL, LDLCALC, LDLDIRECT  Additional studies/ records that were reviewed today include:  Cardiac catheterization 08/26/16: Coronary Diagrams   Diagnostic Diagram           ASSESSMENT:    1. Chronic atrial fibrillation ([place])   2. Coronary artery disease involving coronary bypass graft of native heart with angina pectoris (  [place])   3. Essential hypertension   4. Chronic combined systolic and diastolic heart failure ([place])   5. Encounter for therapeutic drug monitoring   6. Pacemaker      PLAN:  In order of problems listed above:  1. Rate is well controlled. Since diltiazem can depress LV systolic contractility, I will discontinue this medication and increase digoxin to 0.125 mg per day. We will do a basic metabolic panel and digoxin level on return in 2 weeks with me or an APP. 2. Widely patent coronary arteries. Please see cath note above. Decrease in LV function is not ischemic in origin. 3. Blood pressure is relatively low. Discontinuation of diltiazem would help blood pressure. 4. There is no evidence of volume overload however medical regimen has not been optimized. Discontinue diltiazem. Increase digoxin to 0.125 mg daily. Check digoxin level as noted above. At next visit if possible convert to carvedilol 25 mg twice a day and discontinue Toprol. The next goal following that will be to optimize ACE inhibitor dose. 5. Continue Coumadin therapy. Report any bleeding.   Overall he has done well post-cath. We now are attempting to optimize medical therapy. This is noted above. Two-week follow-up appointment with dig level and BMET. After medication was optimized, LV function will be reassessed. If function is still low, may need to consider AICD/EP consult.    Medication Adjustments/Labs and Tests Ordered: Current medicines are reviewed at length with the patient today.  Concerns regarding medicines are  outlined above.  Medication changes, Labs and Tests ordered today are listed in the Patient Instructions below. There are no Patient Instructions on file for this visit.   Signed, Sinclair Grooms, MD  09/17/2016 9:40 AM    [facility] [address], [place], [place]  [address] Phone: [phone]; Fax: [phone]

## 2016-09-17 ENCOUNTER — Ambulatory Visit: Payer: Medicare Other | Admitting: Interventional Cardiology

## 2016-09-17 ENCOUNTER — Ambulatory Visit (INDEPENDENT_AMBULATORY_CARE_PROVIDER_SITE_OTHER): Payer: Medicare Other | Admitting: Interventional Cardiology

## 2016-09-17 ENCOUNTER — Encounter: Payer: Self-pay | Admitting: Interventional Cardiology

## 2016-09-17 VITALS — BP 94/70 | HR 82 | Ht 71.0 in | Wt 218.4 lb

## 2016-09-17 DIAGNOSIS — Z950 Presence of cardiac pacemaker: Secondary | ICD-10-CM

## 2016-09-17 DIAGNOSIS — I482 Chronic atrial fibrillation, unspecified: Secondary | ICD-10-CM

## 2016-09-17 DIAGNOSIS — I5042 Chronic combined systolic (congestive) and diastolic (congestive) heart failure: Secondary | ICD-10-CM | POA: Diagnosis not present

## 2016-09-17 DIAGNOSIS — I25709 Atherosclerosis of coronary artery bypass graft(s), unspecified, with unspecified angina pectoris: Secondary | ICD-10-CM

## 2016-09-17 DIAGNOSIS — Z79899 Other long term (current) drug therapy: Secondary | ICD-10-CM

## 2016-09-17 DIAGNOSIS — I10 Essential (primary) hypertension: Secondary | ICD-10-CM | POA: Diagnosis not present

## 2016-09-17 DIAGNOSIS — Z5181 Encounter for therapeutic drug level monitoring: Secondary | ICD-10-CM

## 2016-09-17 MED ORDER — DIGOXIN 125 MCG PO TABS: 0.1250 mg | ORAL_TABLET | Freq: Every day | ORAL | 3 refills | Status: DC

## 2016-09-17 NOTE — Patient Instructions (Addendum)
Medication Instructions:  1) DISCONTINUE Diltiazem 2) INCREASE Digoxin to 0.125mg  once daily  Labwork: BMET and Dig level at time of next appointment  Testing/Procedures: None  Follow-Up: Your physician recommends that you schedule a follow-up appointment in: 2-3 weeks with Dr. Tamala Julian or APP on a day he is here. (Needs an afternoon appointment)   Any Other Special Instructions Will Be Listed Below (If Applicable).     If you need a refill on your cardiac medications before your next appointment, please call your pharmacy.

## 2016-10-07 ENCOUNTER — Encounter: Payer: Self-pay | Admitting: Physician Assistant

## 2016-10-07 NOTE — Progress Notes (Signed)
Cardiology Office Note:    Date:  10/08/2016   ID:  ZAID TOMES, DOB November 17, 1932, MRN [id number]  PCP:  Lavone Orn, MD  Cardiologist:  Dr. Daneen Schick Electrophysiologist: Dr. Caryl Comes  Referring MD: Lavone Orn, MD   Chief Complaint  Patient presents with  . Congestive Heart Failure  . Atrial Fibrillation    History of Present Illness:    Timothy Harper is a 83 y.o. male with a past medical history significant for CAD S/P CABG 1994 and native vessel PCI, sinus node dysfunction with atrial fibrillation, PPM ([employer]),  hypertension, embolic CVA and chronic anticoagulation.   In early May the patient was noted to have significant orthopnea but no chest discomfort.  Echocardiogram showed a decreased LV function with EF 30-35%, diffuse hypokinesis, mild-mod MR, severely dilated LA and mod dilated RA. In evaluation for the decrease LV function a right & left heart cath was done on 08/26/16 showing patent grafts and patent mid LAD stent. LV dysfunction is not felt to be ischemic in origin. His medical therapy is being optimized. His diltiazem was discontinued at last visit on 7/18. His digoxin was also increase (dig level <0.4 on 6/20) with plan to change Toprol to carvedilol at this visit and follow that with increase in ACE-I next.   The patient is here today alone. He continues to feel better since last appointment. He went for a walk and did fine except got a little short of breath walking uphill. He is tolerating his daily activities without any exertional symptoms. He did have some trouble when he was outside hitting golf balls in the heat and had to go in. He had been having trouble with leg cramps, however, this improved as he began to pay attention to adequate fluid intake during the day. He denies chest discomfort, orthopnea, PND, lightheadedness. He is taking his Lasix every day, however, he complains about the frequent urination.He had had significant swelling in the right  ankle that was presumably due to gout and this has finally resolved.  Past Medical History:  Diagnosis Date  . 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 Atrial fibrillation ([place])   . BPH (benign prostatic hyperplasia)   . Cardiac pacemaker in situ   . Chronic atrial fibrillation ([place])   . Coronary artery disease involving coronary bypass graft 1994   LIMA to RCA . Prior PCI on RCA with restenosis  . CVA (cerebral infarction) 1995  . CVA (cerebral infarction)    Right  . Erectile dysfunction   . GERD (gastroesophageal reflux disease)   . Hearing loss    hearing aids  . Herpes zoster   . HTN (hypertension)   . Hyperlipemia   . Hypothyroidism   . Long term (current) use of anticoagulants   . Melanoma [facility])    right ear, Rosana Hoes 2002 and left arm 1997, [place]  . Primary male hypogonadism     Past Surgical History:  Procedure Laterality Date  . RIGHT/LEFT HEART CATH AND CORONARY/GRAFT ANGIOGRAPHY N/A 08/26/2016   08/26/16- Patent bypass grafts    Current Medications: Current Meds  Medication Sig  . acetaminophen (TYLENOL) 500 MG tablet Take 1,000 mg by mouth 2 (two) times daily as needed for moderate pain or headache.  Marland Kitchen aspirin 325 MG tablet Take 325 mg by mouth as needed.  Marland Kitchen aspirin-sod bicarb-citric acid (ALKA-SELTZER) 325 MG TBEF tablet Take 325 mg by mouth daily as needed (acid reflux).  Marland Kitchen atorvastatin (  LIPITOR) 20 MG tablet Take 20 mg by mouth at bedtime.   . Cholecalciferol (VITAMIN D3) 1000 units CAPS Take 1,000 Units by mouth daily.  . colchicine 0.6 MG tablet Take 0.6 mg by mouth daily. On hold as of 10/08/16  . DENTA 5000 PLUS 1.1 % CREA dental cream Apply 1 application topically at bedtime.   . digoxin (LANOXIN) 0.125 MG tablet Take 1 tablet (0.125 mg total) by mouth daily.  . furosemide (LASIX) 40 MG tablet Take 1 tablet (40 mg total) by mouth daily.  Marland Kitchen levothyroxine (SYNTHROID, LEVOTHROID) 112 MCG tablet Take 112 mcg by mouth daily before  breakfast.  . lisinopril (PRINIVIL,ZESTRIL) 10 MG tablet Take 1 tablet (10 mg total) by mouth daily.  . Magnesium 250 MG TABS Take 250 mg by mouth daily.  . potassium chloride SA (K-DUR,KLOR-CON) 20 MEQ tablet Take 1 tablet (20 mEq total) by mouth daily.  . sildenafil (VIAGRA) 100 MG tablet Take 100 mg by mouth daily as needed for erectile dysfunction.  . tamsulosin (FLOMAX) 0.4 MG CAPS capsule Take 0.4 mg by mouth every evening.   . Testosterone (FORTESTA) 10 MG/ACT (2%) GEL Place onto the skin. 4 pumps daily  . vardenafil (LEVITRA) 20 MG tablet Take 20 mg by mouth daily as needed for erectile dysfunction.  Marland Kitchen warfarin (COUMADIN) 5 MG tablet Take 5 mg by mouth daily. Take 5mg s by mouth daily except fridays dont take any warfarin  . [DISCONTINUED] metoprolol succinate (TOPROL-XL) 50 MG 24 hr tablet Take 1 tablet (50 mg total) by mouth 2 (two) times daily.     Allergies:   Penicillins and Prednisone   Social History   Social History  . Marital status: Married    Spouse name: N/A  . Number of children: 4  . Years of education: N/A   Occupational History  . financial advisor     retired   Social History Main Topics  . Smoking status: Former Smoker    Quit date: 08/01/1988  . Smokeless tobacco: Never Used  . Alcohol use Yes     Comment: 2-3 times a week  . Drug use: No  . Sexual activity: Not Asked   Other Topics Concern  . None   Social History Narrative  . None     Family History: The patient's family history includes Heart disease in his mother. ROS:   Please see the history of present illness.     All other systems reviewed and are negative.  EKGs/Labs/Other Studies Reviewed:    The following studies were reviewed today:  Echocardiogram 07/23/2016 decreased LV function with EF 30-35%, diffuse hypokinesis, mild-mod MR, severely dilated LA and mod dilated RA.  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  Patent right internal mammary graft to the distal RCA. 70% obstruction in the second left ventricular branch of the distal right coronary  Widely patent left main  Widely patent proximal to mid LAD stent. Proximal and mid Timothy Harper narrowing within the LAD.  Widely patent circumflex coronary artery.  Upper normal right heart pressures with mildly elevated right atrial pressure. Normal mean pulmonary capillary wedge pressure of 13 mmHg.  Dilated and globally hypocontractile left ventricle. LVEF 25-30%. Normal LVEDP at 11 mmHg. Findings are compatible with chronic compensated systolic heart failure.  RECOMMENDATIONS:  Medical therapy and optimization  We will need electrophysiology opinion concerning possible AICD, although  currently on medical therapy the patient is well compensated and may not be needed.     EKG:  EKG is ordered today.  The ekg ordered today demonstrates atrial fibrillation with PVC's or aberrantly conducted complexes, 107 bpm  Recent Labs: 07/09/2016: NT-Pro BNP 1,025; TSH 2.800 08/20/2016: ALT 61; Hemoglobin 15.1; Platelets 225 09/05/2016: BUN 21; Creatinine, Ser 1.35; Potassium 5.0; Sodium 136   Recent Lipid Panel No results found for: CHOL, TRIG, HDL, CHOLHDL, VLDL, LDLCALC, LDLDIRECT  Physical Exam:    VS:  BP 128/70   Pulse (!) 107   Ht 5\' 11"  (1.803 m)   Wt 219 lb (99.3 kg)   BMI 30.54 kg/m     Wt Readings from Last 3 Encounters:  10/08/16 219 lb (99.3 kg)  09/17/16 218 lb 6.4 oz (99.1 kg)  08/26/16 224 lb (101.6 kg)     Physical Exam  Constitutional: He is oriented to person, place, and time. He appears well-developed and well-nourished. No distress.  HENT:  Head: Normocephalic and atraumatic.  Neck: Normal range of motion. Neck supple. No JVD present. Carotid bruit is not present.  Cardiovascular: Normal rate, regular rhythm and normal heart sounds.  Exam reveals no gallop  and no friction rub.   No murmur heard. Pulmonary/Chest: Effort normal and breath sounds normal. No respiratory distress. He has no wheezes. He has no rales. He exhibits no tenderness.  Abdominal: Soft. Bowel sounds are normal. He exhibits no distension. There is no tenderness.  Musculoskeletal: Normal range of motion. He exhibits no edema or deformity.  Neurological: He is alert and oriented to person, place, and time.  Skin: Skin is warm and dry.  Psychiatric: He has a normal mood and affect. His behavior is normal.  Vitals reviewed.  ASSESSMENT:    1. Chronic combined systolic and diastolic heart failure ([place])   2. Chronic atrial fibrillation (HCC)   3. Coronary artery disease involving native coronary artery of native heart without angina pectoris   4. Essential hypertension   5. Pacemaker   6. Encounter for therapeutic drug monitoring    PLAN:    In order of problems listed above:  1. Chronic systolic and diastolic heart failure -Recent echocardiogram done for evaluation of orthopnea showed EF 30-35%. Left and right heart cath revealed no ischemia. -Dr. Tamala Julian has been working to optimize medical therapy. His diltiazem was discontinued at his last appointment. His digoxin dosing was increased. -Patient reports continued improvement in symptoms. He is having no orthopnea, PND, edema. He is trying to get to be more active and has some dyspnea on exertion with walking up hill. He appears euvolemic today. -Per Dr. Thompson Caul plan, will switch Toprol to carvedilol 25 mg twice a day today. Digoxin was increased at last appointment so we'll check a dig level today. Will also check a BMet -The plan is to reassess these medication changes in a few weeks and continue to optimize therapy with his ACE inhibitor. Also plan to recheck LV function after medical therapy fully optimized. If function is still low may need AICD/EP consult -Continue current lasix dosing.   2. Permanent Atrial fib -Heart  rate is a little elevated today at 107 bpm with discontinuation of diltiazem at last appointment. The patient has no palpitations and seems to be tolerating this well.  -Twitching Toprol to carvedilol today. Will follow-up in 3-4 weeks to assess tolerance and effect on heart rate.  -Digoxin dosing was increased at last appointment. Will check dig level today.  -Continue  warfarin for stroke risk reduction. Last INR was therapeutic at 2.0. The patient denies any unusual bleeding.   3. CAD -Status post CABG in 1994. In evaluation for orthopnea and reduced LV function, right and left heart cath was done on 08/26/16 showing patent grafts. Ischemia was not felt to be the cause of LV dysfunction. -Currently the patient denies any angina symptoms  4. Hypertension -Blood pressure was a little low at his last appointment. Diltiazem was DC'd due to decreased LV function. Blood pressure is well controlled today. -Reassess at next appointment with change from Toprol to carvedilol  5. Pacemaker -Followed by Dr. Caryl Comes   Medication Adjustments/Labs and Tests Ordered: Current medicines are reviewed at length with the patient today.  Concerns regarding medicines are outlined above. Labs and tests ordered and medication changes are outlined in the patient instructions below:  Patient Instructions  Medication Instructions:  1. STOP METOPROLOL  2. START CARVEDILOL 6.25 MG 1 TABLET TWICE DAILY (SPACE OUT 12 HOURS APART)  Labwork: TODAY BMET, DIGOXIN LEVEL  Testing/Procedures: NONE ORDERED   Follow-Up: 11/04/16 @ 2 PM WITH Truitt Merle, NP    Any Other Special Instructions Will Be Listed Below (If Applicable).     If you need a refill on your cardiac medications before your next appointment, please call your pharmacy.      Signed, Daune Perch, NP  10/08/2016 9:01 AM    [place]

## 2016-10-08 ENCOUNTER — Ambulatory Visit (INDEPENDENT_AMBULATORY_CARE_PROVIDER_SITE_OTHER): Payer: Medicare Other | Admitting: Cardiology

## 2016-10-08 ENCOUNTER — Other Ambulatory Visit: Payer: Medicare Other

## 2016-10-08 ENCOUNTER — Encounter: Payer: Self-pay | Admitting: Physician Assistant

## 2016-10-08 VITALS — BP 128/70 | HR 107 | Ht 71.0 in | Wt 219.0 lb

## 2016-10-08 DIAGNOSIS — I2510 Atherosclerotic heart disease of native coronary artery without angina pectoris: Secondary | ICD-10-CM | POA: Diagnosis not present

## 2016-10-08 DIAGNOSIS — Z950 Presence of cardiac pacemaker: Secondary | ICD-10-CM

## 2016-10-08 DIAGNOSIS — I5042 Chronic combined systolic (congestive) and diastolic (congestive) heart failure: Secondary | ICD-10-CM

## 2016-10-08 DIAGNOSIS — I482 Chronic atrial fibrillation, unspecified: Secondary | ICD-10-CM

## 2016-10-08 DIAGNOSIS — Z5181 Encounter for therapeutic drug level monitoring: Secondary | ICD-10-CM

## 2016-10-08 DIAGNOSIS — I10 Essential (primary) hypertension: Secondary | ICD-10-CM | POA: Diagnosis not present

## 2016-10-08 LAB — BASIC METABOLIC PANEL
BUN/Creatinine Ratio: 19 (ref 10–24)
BUN: 24 mg/dL (ref 8–27)
CHLORIDE: 102 mmol/L (ref 96–106)
CO2: 22 mmol/L (ref 20–29)
Calcium: 9.7 mg/dL (ref 8.6–10.2)
Creatinine, Ser: 1.28 mg/dL — ABNORMAL HIGH (ref 0.76–1.27)
GFR, EST AFRICAN AMERICAN: 60 mL/min/{1.73_m2} (ref >59)
GFR, EST NON AFRICAN AMERICAN: 52 mL/min/{1.73_m2} — AB (ref >59)
Glucose: 91 mg/dL (ref 65–99)
POTASSIUM: 4.8 mmol/L (ref 3.5–5.2)
SODIUM: 141 mmol/L (ref 134–144)

## 2016-10-08 LAB — DIGOXIN LEVEL: Digoxin, Serum: 1 ng/mL — ABNORMAL HIGH (ref 0.5–0.9)

## 2016-10-08 MED ORDER — CARVEDILOL 25 MG PO TABS: 25.0000 mg | ORAL_TABLET | Freq: Two times a day (BID) | ORAL | 3 refills | Status: DC

## 2016-10-08 MED ORDER — CARVEDILOL 6.25 MG PO TABS: 6.2500 mg | ORAL_TABLET | Freq: Two times a day (BID) | ORAL | 3 refills | Status: DC

## 2016-10-08 NOTE — Addendum Note (Signed)
Addended by: Michae Kava on: 10/08/2016 09:09 AM   Modules accepted: Orders

## 2016-10-08 NOTE — Patient Instructions (Addendum)
Medication Instructions:  1. STOP METOPROLOL  2. START CARVEDILOL 25 MG 1 TABLET TWICE DAILY (SPACE OUT 12 HOURS APART)  Labwork: TODAY BMET, DIGOXIN LEVEL  Testing/Procedures: NONE ORDERED   Follow-Up: 11/04/16 @ 2 PM WITH Truitt Merle, NP    Any Other Special Instructions Will Be Listed Below (If Applicable).     If you need a refill on your cardiac medications before your next appointment, please call your pharmacy.

## 2016-10-09 ENCOUNTER — Other Ambulatory Visit: Payer: Self-pay | Admitting: Cardiology

## 2016-10-09 ENCOUNTER — Telehealth: Payer: Self-pay | Admitting: *Deleted

## 2016-10-09 MED ORDER — SACUBITRIL-VALSARTAN 24-26 MG PO TABS: 1.0000 | ORAL_TABLET | Freq: Two times a day (BID) | ORAL | 11 refills | Status: DC

## 2016-10-09 NOTE — Telephone Encounter (Signed)
-----   Message from Belva Crome, MD sent at 10/08/2016 11:36 AM EDT ----- Regarding: Med Change Stop lisinopril next Monday. This will allow adjustment to Carvedilol. 48 hours later, please start Entresto 25/26 mg bid and return to see me 5-7 days later with BMET. ----- Message ----- From: Daune Perch, NP Sent: 10/08/2016   9:02 AM To: Belva Crome, MD  Pt's HR up to 107. He is feeling well. I changed his Toprol to carvedilol as you outlined.

## 2016-10-09 NOTE — Telephone Encounter (Signed)
Spoke with pt and went over recommendations per Dr. Tamala Julian.  Pt verbalized understanding and was in agreement with this plan.  Pt will plan to start Entresto on Wednesday, 8/15 and come in to see Dr. Tamala Julian on 10/23/16 for OV and labs.  Pt will come by and get samples of Entresto prior to Wednesday.  Pt appreciative for call.

## 2016-10-10 ENCOUNTER — Telehealth: Payer: Self-pay | Admitting: *Deleted

## 2016-10-10 NOTE — Telephone Encounter (Signed)
-----   Message from Daune Perch, NP sent at 10/10/2016  7:54 AM EDT ----- Please inform patient that his metabolic panel was OK. His kidney function is stable. However, his dig level is slightly high. His previous dig level was not detectable when he was supposed to be taking it 3 times per week. Was he actually taking it at that time? It was then increased to daily, which may be a little too much for him. He should cut back to 4 times per week- Tues, Thurs, Sat, Sun. Daune Perch, NP

## 2016-10-10 NOTE — Telephone Encounter (Signed)
Pt has been notified of lab results and findings by phone with verbal understanding. Pt agreeable to change digoxin to every Tues, Thurs, Sat and Sunday's. Pt also wanted to confirm when he was supposed to stop Lisinopril and start Entresto 25/26 mg BID. I advised pt per Dr. Thompson Caul nurse Marveen Reeks. LPN and Dr. Thompson Caul recommendations pt will take his last dose of Lisinopril on Sunday 8/12, start Entresto on Wed 8/15. Pt aware he will not take any Lisinopril on Monday 8/13 or Tuesday 8/14. Pt verbalized understanding x 2 with read back. I advised pt if he has any questions at all about his medications to call the office and we will will be happy to help. Pt thanked me for my call.

## 2016-10-22 NOTE — Progress Notes (Signed)
Cardiology Office Note    Date:  10/23/2016   ID:  Timothy Harper, DOB 02-21-1933, MRN [id number]  PCP:  Lavone Orn, MD  Cardiologist: Sinclair Grooms, MD   Chief Complaint  Patient presents with  . Congestive Heart Failure  . Coronary Artery Disease    History of Present Illness:  [name] is a 83 y.o. male with history of coronary artery disease with prior coronary bypass grafting and native vessel PCI, tachybradycardia syndrome with atrial fibrillation, hypertension, embolic CVA, and chronic anticoagulation therapy.  Having some intermittent nausea. Breathing is better. Tolerating entrust oh okay. Some mild dizziness if he bends over and stands upright. No peripheral edema. Denies headache and chest pain.  Past Medical History:  Diagnosis Date  . 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 Atrial fibrillation ([place])   . BPH (benign prostatic hyperplasia)   . Cardiac pacemaker in situ   . Chronic atrial fibrillation ([place])   . Coronary artery disease involving coronary bypass graft 1994   LIMA to RCA . Prior PCI on RCA with restenosis  . CVA (cerebral infarction) 1995  . CVA (cerebral infarction)    Right  . Erectile dysfunction   . GERD (gastroesophageal reflux disease)   . Hearing loss    hearing aids  . Herpes zoster   . HTN (hypertension)   . Hyperlipemia   . Hypothyroidism   . Long term (current) use of anticoagulants   . Melanoma [facility])    right ear, Rosana Hoes 2002 and left arm 1997, [place]  . Primary male hypogonadism     Past Surgical History:  Procedure Laterality Date  . RIGHT/LEFT HEART CATH AND CORONARY/GRAFT ANGIOGRAPHY N/A 08/26/2016   08/26/16- Patent bypass grafts    Current Medications: Outpatient Medications Prior to Visit  Medication Sig Dispense Refill  . acetaminophen (TYLENOL) 500 MG tablet Take 1,000 mg by mouth 2 (two) times daily as needed for moderate pain or headache.    Marland Kitchen aspirin 325 MG tablet Take  325 mg by mouth as needed.    Marland Kitchen aspirin-sod bicarb-citric acid (ALKA-SELTZER) 325 MG TBEF tablet Take 325 mg by mouth daily as needed (acid reflux).    Marland Kitchen atorvastatin (LIPITOR) 20 MG tablet Take 20 mg by mouth at bedtime.     . carvedilol (COREG) 25 MG tablet Take 1 tablet (25 mg total) by mouth 2 (two) times daily. 180 tablet 3  . Cholecalciferol (VITAMIN D3) 1000 units CAPS Take 1,000 Units by mouth daily.    . colchicine 0.6 MG tablet Take 0.6 mg by mouth daily. On hold as of 10/08/16    . DENTA 5000 PLUS 1.1 % CREA dental cream Apply 1 application topically at bedtime.     . digoxin (LANOXIN) 0.125 MG tablet Take 1 tablet (0.125 mg total) by mouth daily. 90 tablet 3  . levothyroxine (SYNTHROID, LEVOTHROID) 112 MCG tablet Take 112 mcg by mouth daily before breakfast.    . Magnesium 250 MG TABS Take 250 mg by mouth daily.    . potassium chloride SA (K-DUR,KLOR-CON) 20 MEQ tablet Take 1 tablet (20 mEq total) by mouth daily. 90 tablet 3  . sacubitril-valsartan (ENTRESTO) 24-26 MG Take 1 tablet by mouth 2 (two) times daily. 60 tablet 11  . sildenafil (VIAGRA) 100 MG tablet Take 100 mg by mouth daily as needed for erectile dysfunction.    . tamsulosin (FLOMAX) 0.4 MG CAPS capsule Take 0.4 mg by mouth  every evening.     . Testosterone (FORTESTA) 10 MG/ACT (2%) GEL Place onto the skin. 4 pumps daily    . vardenafil (LEVITRA) 20 MG tablet Take 20 mg by mouth daily as needed for erectile dysfunction.    Marland Kitchen warfarin (COUMADIN) 5 MG tablet Take 5 mg by mouth daily. Take 5mg s by mouth daily except fridays dont take any warfarin    . furosemide (LASIX) 40 MG tablet Take 1 tablet (40 mg total) by mouth daily. 90 tablet 3   No facility-administered medications prior to visit.      Allergies:   Penicillins and Prednisone   Social History   Social History  . Marital status: Married    Spouse name: N/A  . Number of children: 4  . Years of education: N/A   Occupational History  . financial advisor      retired   Social History Main Topics  . Smoking status: Former Smoker    Quit date: 08/01/1988  . Smokeless tobacco: Never Used  . Alcohol use Yes     Comment: 2-3 times a week  . Drug use: No  . Sexual activity: Not Asked   Other Topics Concern  . None   Social History Narrative  . None     Family History:  The patient's family history includes Heart disease in his mother.   ROS:   Please see the history of present illness.    Occasional GI upset recently. Also complaining of dry hacking cough. He may still be taking lisinopril along with interest oh.  All other systems reviewed and are negative.   PHYSICAL EXAM:   VS:  BP 92/60 (BP Location: Right Arm)   Pulse 63   Ht 5\' 11"  (1.803 m)   Wt 218 lb 12.8 oz (99.2 kg)   BMI 30.52 kg/m    GEN: Well nourished, well developed, in no acute distress  HEENT: normal  Neck: no JVD, carotid bruits, or masses Cardiac: IIRR; no murmurs, rubs, or gallops,no edema  Respiratory:  clear to auscultation bilaterally, normal work of breathing GI: soft, nontender, nondistended, + BS MS: no deformity or atrophy  Skin: warm and dry, no rash Neuro:  Alert and Oriented x 3, Strength and sensation are intact Psych: euthymic mood, full affect  Wt Readings from Last 3 Encounters:  10/23/16 218 lb 12.8 oz (99.2 kg)  10/08/16 219 lb (99.3 kg)  09/17/16 218 lb 6.4 oz (99.1 kg)      Studies/Labs Reviewed:   EKG:  EKG  Not repeated  Recent Labs: 07/09/2016: NT-Pro BNP 1,025; TSH 2.800 08/20/2016: ALT 61; Hemoglobin 15.1; Platelets 225 10/08/2016: BUN 24; Creatinine, Ser 1.28; Potassium 4.8; Sodium 141   Lipid Panel No results found for: CHOL, TRIG, HDL, CHOLHDL, VLDL, LDLCALC, LDLDIRECT  Additional studies/ records that were reviewed today include:  No new data    ASSESSMENT:    1. Chronic atrial fibrillation ([place])   2. Sinus node dysfunction (HCC)   3. Coronary artery disease involving coronary bypass graft of native heart with  angina pectoris ([place])   4. Chronic combined systolic and diastolic heart failure ([place])   5. Essential hypertension   6. Pacemaker      PLAN:  In order of problems listed above:  1. Heart rate ranges in the 60-80 bpm. He is unaware of any palpitations. With development of nausea and GI complaints, rule out dig toxicity. Digoxin level was 1.0 on the last office visit. Upper normal is 0.9. Decrease  digoxin to 0.0625 mg daily which he will take in the form of 0.125 mg on Monday Wednesday and Friday. If this persists, I may discontinue digoxin. 2. Not addressed 3. No angina 4. No volume overload or symptoms of congestion. 5. Relatively low blood pressure. Continue Entresto. Verify that he is not taking lisinopril.  Basic metabolic panel today. Clinical follow-up in 6-8 weeks. Basic metabolic panel at that time. If on the next visit blood pressure is better we will consider increasing Entresto 100 mg twice daily. Later this year we will repeat his echocardiogram.    Medication Adjustments/Labs and Tests Ordered: Current medicines are reviewed at length with the patient today.  Concerns regarding medicines are outlined above.  Medication changes, Labs and Tests ordered today are listed in the Patient Instructions below. There are no Patient Instructions on file for this visit.   Signed, Sinclair Grooms, MD  10/23/2016 10:39 AM    [facility] [place], [place], [place]  [address] Phone: [phone]; Fax: [phone]

## 2016-10-23 ENCOUNTER — Encounter: Payer: Self-pay | Admitting: Interventional Cardiology

## 2016-10-23 ENCOUNTER — Ambulatory Visit (INDEPENDENT_AMBULATORY_CARE_PROVIDER_SITE_OTHER): Payer: Medicare Other | Admitting: Interventional Cardiology

## 2016-10-23 VITALS — BP 92/60 | HR 63 | Ht 71.0 in | Wt 218.8 lb

## 2016-10-23 DIAGNOSIS — I5042 Chronic combined systolic (congestive) and diastolic (congestive) heart failure: Secondary | ICD-10-CM

## 2016-10-23 DIAGNOSIS — I25709 Atherosclerosis of coronary artery bypass graft(s), unspecified, with unspecified angina pectoris: Secondary | ICD-10-CM | POA: Diagnosis not present

## 2016-10-23 DIAGNOSIS — I495 Sick sinus syndrome: Secondary | ICD-10-CM

## 2016-10-23 DIAGNOSIS — I482 Chronic atrial fibrillation, unspecified: Secondary | ICD-10-CM

## 2016-10-23 DIAGNOSIS — Z950 Presence of cardiac pacemaker: Secondary | ICD-10-CM | POA: Diagnosis not present

## 2016-10-23 DIAGNOSIS — I10 Essential (primary) hypertension: Secondary | ICD-10-CM | POA: Diagnosis not present

## 2016-10-23 LAB — BASIC METABOLIC PANEL
BUN / CREAT RATIO: 13 (ref 10–24)
BUN: 19 mg/dL (ref 8–27)
CHLORIDE: 101 mmol/L (ref 96–106)
CO2: 22 mmol/L (ref 20–29)
Calcium: 9.4 mg/dL (ref 8.6–10.2)
Creatinine, Ser: 1.44 mg/dL — ABNORMAL HIGH (ref 0.76–1.27)
GFR calc Af Amer: 52 mL/min/{1.73_m2} — ABNORMAL LOW (ref >59)
GFR calc non Af Amer: 45 mL/min/{1.73_m2} — ABNORMAL LOW (ref >59)
GLUCOSE: 123 mg/dL — AB (ref 65–99)
Potassium: 4.4 mmol/L (ref 3.5–5.2)
SODIUM: 141 mmol/L (ref 134–144)

## 2016-10-23 MED ORDER — DIGOXIN 125 MCG PO TABS: 0.1250 mg | ORAL_TABLET | ORAL | 3 refills | Status: DC

## 2016-10-23 NOTE — Patient Instructions (Signed)
Medication Instructions:  1) DECREASE Digoxin to one tablet on Monday, Wednesday and Friday, 2) Make sure you have removed Lisinopril from your pill box and discard this medication.   Labwork: BMET today  Testing/Procedures: None  Follow-Up: Your physician recommends that you schedule a follow-up appointment in: 2 months with Dr. Tamala Julian.    Any Other Special Instructions Will Be Listed Below (If Applicable).     If you need a refill on your cardiac medications before your next appointment, please call your pharmacy.

## 2016-11-04 ENCOUNTER — Ambulatory Visit: Payer: Medicare Other | Admitting: Nurse Practitioner

## 2016-12-25 ENCOUNTER — Ambulatory Visit (INDEPENDENT_AMBULATORY_CARE_PROVIDER_SITE_OTHER): Payer: Medicare Other | Admitting: Interventional Cardiology

## 2016-12-25 VITALS — BP 84/62 | HR 58 | Ht 71.0 in | Wt 218.8 lb

## 2016-12-25 DIAGNOSIS — I10 Essential (primary) hypertension: Secondary | ICD-10-CM

## 2016-12-25 DIAGNOSIS — I482 Chronic atrial fibrillation, unspecified: Secondary | ICD-10-CM

## 2016-12-25 DIAGNOSIS — I959 Hypotension, unspecified: Secondary | ICD-10-CM

## 2016-12-25 DIAGNOSIS — I25709 Atherosclerosis of coronary artery bypass graft(s), unspecified, with unspecified angina pectoris: Secondary | ICD-10-CM

## 2016-12-25 DIAGNOSIS — I5042 Chronic combined systolic (congestive) and diastolic (congestive) heart failure: Secondary | ICD-10-CM

## 2016-12-25 DIAGNOSIS — Z950 Presence of cardiac pacemaker: Secondary | ICD-10-CM

## 2016-12-25 LAB — BASIC METABOLIC PANEL
BUN / CREAT RATIO: 18 (ref 10–24)
BUN: 24 mg/dL (ref 8–27)
CO2: 23 mmol/L (ref 20–29)
Calcium: 10 mg/dL (ref 8.6–10.2)
Chloride: 104 mmol/L (ref 96–106)
Creatinine, Ser: 1.32 mg/dL — ABNORMAL HIGH (ref 0.76–1.27)
GFR calc non Af Amer: 50 mL/min/{1.73_m2} — ABNORMAL LOW (ref >59)
GFR, EST AFRICAN AMERICAN: 58 mL/min/{1.73_m2} — AB (ref >59)
Glucose: 109 mg/dL — ABNORMAL HIGH (ref 65–99)
POTASSIUM: 4.9 mmol/L (ref 3.5–5.2)
SODIUM: 143 mmol/L (ref 134–144)

## 2016-12-25 NOTE — Progress Notes (Signed)
Cardiology Office Note    Date:  12/25/2016   ID:  Timothy Harper, DOB [birth year], MRN [id number]  PCP:  Lavone Orn, MD  Cardiologist: Sinclair Grooms, MD   Chief Complaint  Patient presents with  . Coronary Artery Disease  . Congestive Heart Failure  . Atrial Fibrillation    History of Present Illness:  Timothy Harper is a 83 y.o. male with history of coronary artery disease with prior coronary bypass grafting and native vessel PCI, tachybradycardia syndrome with atrial fibrillation, hypertension, embolic CVA, and chronic anticoagulation therapy.  He feels better. Exertional intolerance continues but has improved. He is able to lie flat in bed. He has not been exercising. He notices relatively low blood pressures when he records sent.   Past Medical History:  Diagnosis Date  . 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 Atrial fibrillation ([place])   . BPH (benign prostatic hyperplasia)   . Cardiac pacemaker in situ   . Chronic atrial fibrillation ([place])   . Coronary artery disease involving coronary bypass graft 1994   LIMA to RCA . Prior PCI on RCA with restenosis  . CVA (cerebral infarction) 1995  . CVA (cerebral infarction)    Right  . Erectile dysfunction   . GERD (gastroesophageal reflux disease)   . Hearing loss    hearing aids  . Herpes zoster   . HTN (hypertension)   . Hyperlipemia   . Hypothyroidism   . Long term (current) use of anticoagulants   . Melanoma [facility])    right ear, Rosana Hoes 2002 and left arm 1997, [place]  . Primary male hypogonadism     Past Surgical History:  Procedure Laterality Date  . RIGHT/LEFT HEART CATH AND CORONARY/GRAFT ANGIOGRAPHY N/A 08/26/2016   08/26/16- Patent bypass grafts    Current Medications: Outpatient Medications Prior to Visit  Medication Sig Dispense Refill  . acetaminophen (TYLENOL) 500 MG tablet Take 1,000 mg by mouth 2 (two) times daily as needed for moderate pain or headache.    Marland Kitchen  aspirin 325 MG tablet Take 325 mg by mouth as needed.    Marland Kitchen aspirin-sod bicarb-citric acid (ALKA-SELTZER) 325 MG TBEF tablet Take 325 mg by mouth daily as needed (acid reflux).    Marland Kitchen atorvastatin (LIPITOR) 20 MG tablet Take 20 mg by mouth at bedtime.     . carvedilol (COREG) 25 MG tablet Take 1 tablet (25 mg total) by mouth 2 (two) times daily. 180 tablet 3  . Cholecalciferol (VITAMIN D3) 1000 units CAPS Take 1,000 Units by mouth daily.    . colchicine 0.6 MG tablet Take 0.6 mg by mouth daily. On hold as of 10/08/16    . DENTA 5000 PLUS 1.1 % CREA dental cream Apply 1 application topically at bedtime.     . digoxin (LANOXIN) 0.125 MG tablet Take 1 tablet (0.125 mg total) by mouth every Monday, Wednesday, and Friday. 45 tablet 3  . furosemide (LASIX) 40 MG tablet Take 40 mg by mouth daily.    Marland Kitchen levothyroxine (SYNTHROID, LEVOTHROID) 112 MCG tablet Take 112 mcg by mouth daily before breakfast.    . Magnesium 250 MG TABS Take 250 mg by mouth daily.    . sacubitril-valsartan (ENTRESTO) 24-26 MG Take 1 tablet by mouth 2 (two) times daily. 60 tablet 11  . sildenafil (VIAGRA) 100 MG tablet Take 100 mg by mouth daily as needed for erectile dysfunction.    . tamsulosin (FLOMAX) 0.4 MG CAPS  capsule Take 0.4 mg by mouth every evening.     . Testosterone (FORTESTA) 10 MG/ACT (2%) GEL Place onto the skin. 4 pumps daily    . vardenafil (LEVITRA) 20 MG tablet Take 20 mg by mouth daily as needed for erectile dysfunction.    Marland Kitchen warfarin (COUMADIN) 5 MG tablet Take 5 mg by mouth daily. Take 5mg s by mouth daily except fridays dont take any warfarin    . potassium chloride SA (K-DUR,KLOR-CON) 20 MEQ tablet Take 1 tablet (20 mEq total) by mouth daily. 90 tablet 3   No facility-administered medications prior to visit.      Allergies:   Penicillins and Prednisone   Social History   Social History  . Marital status: Married    Spouse name: N/A  . Number of children: 4  . Years of education: N/A   Occupational  History  . financial advisor     retired   Social History Main Topics  . Smoking status: Former Smoker    Quit date: 08/01/1988  . Smokeless tobacco: Never Used  . Alcohol use Yes     Comment: 2-3 times a week  . Drug use: No  . Sexual activity: Not on file   Other Topics Concern  . Not on file   Social History Narrative  . No narrative on file     Family History:  The patient's family history includes Heart disease in his mother.   ROS:   Please see the history of present illness.    Not as energetic his previous. No blood in the urine or stool.  All other systems reviewed and are negative.   PHYSICAL EXAM:   VS:  BP (!) 84/62 (BP Location: Right Arm)   Pulse (!) 58   Ht 5\' 11"  (1.803 m)   Wt 218 lb 12.8 oz (99.2 kg)   BMI 30.52 kg/m    GEN: Well nourished, well developed, in no acute distress  HEENT: normal  Neck: no JVD, carotid bruits, or masses Cardiac: IIRR; no murmurs, rubs, or gallops,no edema  Respiratory:  clear to auscultation bilaterally, normal work of breathing GI: soft, nontender, nondistended, + BS MS: no deformity or atrophy  Skin: warm and dry, no rash Neuro:  Alert and Oriented x 3, Strength and sensation are intact Psych: euthymic mood, full affect  Wt Readings from Last 3 Encounters:  12/25/16 218 lb 12.8 oz (99.2 kg)  10/23/16 218 lb 12.8 oz (99.2 kg)  10/08/16 219 lb (99.3 kg)      Studies/Labs Reviewed:   EKG:  EKG  Not repeated  Recent Labs: 07/09/2016: NT-Pro BNP 1,025; TSH 2.800 08/20/2016: ALT 61; Hemoglobin 15.1; Platelets 225 10/23/2016: BUN 19; Creatinine, Ser 1.44; Potassium 4.4; Sodium 141   Lipid Panel No results found for: CHOL, TRIG, HDL, CHOLHDL, VLDL, LDLCALC, LDLDIRECT  Additional studies/ records that were reviewed today include:  No new data    ASSESSMENT:    1. Chronic atrial fibrillation ([place])   2. Chronic combined systolic and diastolic heart failure ([place])   3. Hypotension, unspecified hypotension type     4. Coronary artery disease involving coronary bypass graft of native heart with angina pectoris ([place])   5. Cardiac pacemaker in situ   6. Essential hypertension      PLAN:  In order of problems listed above:  1. Moderate rate control on beta blocker therapy and digoxin. 2. On good heart failure therapy including carvedilol max dose and low-dose Entresto. Plan basic metabolic panel  today and in 3 months. Echocardiogram in 3 months to determine if LV function has improved. 3. Asymptomatic with systolic blood pressures 95 mmHg. Continue same therapy. Today's blood work will help determine if diuretic regimen can be slightly reduced. 4. Asymptomatic with reference to angina. 5. No new data 6. See discussion above underwent a low blood pressure.  Overall improved. More functional. No evidence of volume overload. 3 month follow-up with basic metabolic panel and 2-D Doppler echocardiogram.  Medication Adjustments/Labs and Tests Ordered: Current medicines are reviewed at length with the patient today.  Concerns regarding medicines are outlined above.  Medication changes, Labs and Tests ordered today are listed in the Patient Instructions below. There are no Patient Instructions on file for this visit.   Signed, Sinclair Grooms, MD  12/25/2016 10:52 AM    [facility] [address], [place], [place]  [address] Phone: [phone]; Fax: [phone]

## 2016-12-25 NOTE — Patient Instructions (Signed)
Medication Instructions:  Your physician recommends that you continue on your current medications as directed. Please refer to the Current Medication list given to you today.  Labwork: BMET today  Your physician recommends that you return for lab work in: 3 months (BMET- same day as echo)   Testing/Procedures: Your physician has requested that you have an echocardiogram right before you see Dr. Tamala Julian back in 3 months. Echocardiography is a painless test that uses sound waves to create images of your heart. It provides your doctor with information about the size and shape of your heart and how well your heart's chambers and valves are working. This procedure takes approximately one hour. There are no restrictions for this procedure.    Follow-Up: Your physician recommends that you schedule a follow-up appointment in: 3 months with Dr. Tamala Julian.    Any Other Special Instructions Will Be Listed Below (If Applicable).     If you need a refill on your cardiac medications before your next appointment, please call your pharmacy.

## 2017-02-16 ENCOUNTER — Telehealth: Payer: Self-pay

## 2017-02-16 NOTE — Telephone Encounter (Signed)
Copied from [place]. Topic: Quick Communication - Lab Results >> Feb 16, 2017  1:21 PM Lolita Rieger, [place] wrote: Pt requesting lab results please call pt

## 2017-02-16 NOTE — Telephone Encounter (Signed)
Ok, thank you

## 2017-02-20 ENCOUNTER — Encounter: Payer: Self-pay | Admitting: Internal Medicine

## 2017-02-20 ENCOUNTER — Ambulatory Visit: Payer: Medicare Other | Admitting: Internal Medicine

## 2017-02-20 VITALS — BP 118/54 | HR 70 | Ht 71.0 in | Wt 224.0 lb

## 2017-02-20 DIAGNOSIS — I482 Chronic atrial fibrillation, unspecified: Secondary | ICD-10-CM

## 2017-02-20 DIAGNOSIS — I495 Sick sinus syndrome: Secondary | ICD-10-CM | POA: Diagnosis not present

## 2017-02-20 DIAGNOSIS — Z950 Presence of cardiac pacemaker: Secondary | ICD-10-CM

## 2017-02-20 NOTE — Progress Notes (Signed)
HPI [name] returns today for ongoing evaluation and management of his PPM. He has an ICM, chronic class 2 CHF, atrial fib, and dyslipidemia.  Allergies  Allergen Reactions  . Penicillins     Swollen face/lips Has patient had a PCN reaction causing immediate rash, facial/tongue/throat swelling, SOB or lightheadedness with hypotension: Yes Has patient had a PCN reaction causing severe rash involving mucus membranes or skin necrosis: No Has patient had a PCN reaction that required hospitalization: No Has patient had a PCN reaction occurring within the last 10 years: No If all of the above answers are "NO", then may proceed with Cephalosporin use.   . Prednisone     swelling     Current Outpatient Medications  Medication Sig Dispense Refill  . acetaminophen (TYLENOL) 500 MG tablet Take 1,000 mg by mouth 2 (two) times daily as needed for moderate pain or headache.    Marland Kitchen aspirin 325 MG tablet Take 325 mg by mouth as needed.    Marland Kitchen aspirin-sod bicarb-citric acid (ALKA-SELTZER) 325 MG TBEF tablet Take 325 mg by mouth daily as needed (acid reflux).    Marland Kitchen atorvastatin (LIPITOR) 20 MG tablet Take 20 mg by mouth at bedtime.     . carvedilol (COREG) 25 MG tablet Take 1 tablet (25 mg total) by mouth 2 (two) times daily. 180 tablet 3  . Cholecalciferol (VITAMIN D3) 1000 units CAPS Take 1,000 Units by mouth daily.    . colchicine 0.6 MG tablet Take 0.6 mg by mouth daily. On hold as of 10/08/16    . DENTA 5000 PLUS 1.1 % CREA dental cream Apply 1 application topically at bedtime.     . digoxin (LANOXIN) 0.125 MG tablet Take 1 tablet (0.125 mg total) by mouth every Monday, Wednesday, and Friday. 45 tablet 3  . furosemide (LASIX) 40 MG tablet Take 40 mg by mouth daily.    Marland Kitchen levothyroxine (SYNTHROID, LEVOTHROID) 112 MCG tablet Take 112 mcg by mouth daily before breakfast.    . Magnesium 250 MG TABS Take 250 mg by mouth daily.    . potassium chloride SA (K-DUR,KLOR-CON) 20 MEQ tablet Take 20 mEq  by mouth daily.    . sacubitril-valsartan (ENTRESTO) 24-26 MG Take 1 tablet by mouth 2 (two) times daily. 60 tablet 11  . sildenafil (VIAGRA) 100 MG tablet Take 100 mg by mouth daily as needed for erectile dysfunction.    . tamsulosin (FLOMAX) 0.4 MG CAPS capsule Take 0.4 mg by mouth every evening.     . Testosterone (FORTESTA) 10 MG/ACT (2%) GEL Place onto the skin. 4 pumps daily    . ULORIC 40 MG tablet Take 40 mg by mouth daily.    . vardenafil (LEVITRA) 20 MG tablet Take 20 mg by mouth daily as needed for erectile dysfunction.    Marland Kitchen warfarin (COUMADIN) 5 MG tablet Take 5 mg by mouth daily. Take 5mg s by mouth daily except fridays dont take any warfarin     No current facility-administered medications for this visit.      Past Medical History:  Diagnosis Date  . 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 Atrial fibrillation ([place])   . BPH (benign prostatic hyperplasia)   . Cardiac pacemaker in situ   . Chronic atrial fibrillation ([place])   . Coronary artery disease involving coronary bypass graft 1994   LIMA to RCA . Prior PCI on RCA with restenosis  . CVA (cerebral infarction) 1995  .  CVA (cerebral infarction)    Right  . Erectile dysfunction   . GERD (gastroesophageal reflux disease)   . Hearing loss    hearing aids  . Herpes zoster   . HTN (hypertension)   . Hyperlipemia   . Hypothyroidism   . Long term (current) use of anticoagulants   . Melanoma [facility])    right ear, Rosana Hoes 2002 and left arm 1997, [place]  . Primary male hypogonadism     ROS:   All systems reviewed and negative except as noted in the HPI.   Past Surgical History:  Procedure Laterality Date  . RIGHT/LEFT HEART CATH AND CORONARY/GRAFT ANGIOGRAPHY N/A 08/26/2016   08/26/16- Patent bypass grafts     Family History  Problem Relation Age of Onset  . Heart disease Mother      Social History   Socioeconomic History  . Marital status: Married    Spouse name: Not on file  .  Number of children: 4  . Years of education: Not on file  . Highest education level: Not on file  Social Needs  . Financial resource strain: Not on file  . Food insecurity - worry: Not on file  . Food insecurity - inability: Not on file  . Transportation needs - medical: Not on file  . Transportation needs - non-medical: Not on file  Occupational History  . Occupation: Music therapist    Comment: retired  Tobacco Use  . Smoking status: Former Smoker    Last attempt to quit: 08/01/1988    Years since quitting: 28.5  . Smokeless tobacco: Never Used  Substance and Sexual Activity  . Alcohol use: Yes    Comment: 2-3 times a week  . Drug use: No  . Sexual activity: Not on file  Other Topics Concern  . Not on file  Social History Narrative  . Not on file     BP (!) 118/54   Pulse 70   Ht 5\' 11"  (1.803 m)   Wt 224 lb (101.6 kg)   SpO2 97%   BMI 31.24 kg/m   Physical Exam:  Well appearing 83 yo man, NAD HEENT: Unremarkable Neck:  6 cm JVD, no thyromegally Lymphatics:  No adenopathy Back:  No CVA tenderness Lungs:  Clear HEART:  IRegular rate rhythm, no murmurs, no rubs, no clicks Abd:  soft, positive bowel sounds, no organomegally, no rebound, no guarding Ext:  2 plus pulses, no edema, no cyanosis, no clubbing Skin:  No rashes no nodules Neuro:  CN II through XII intact, motor grossly intact   DEVICE  Normal device function.  See PaceArt for details.   Assess/Plan: 1. Persistent atrial fib - his ventricular rate is controlled. He will continue his current meds. 2. Chronic systolic heart failure - his symptoms are class 2. He is encouraged to increase his activity and reduce his salt intake. With his advanced age, I do not think he is a good candidate for upgrade to an ICD. 3. PPM - his device has been interogated and working normally. 4. CAD - he has undergone repeat heart cath several months ago and  He denies anginal symptoms.   Mikle Bosworth.D

## 2017-02-20 NOTE — Patient Instructions (Signed)
Medication Instructions:  Your physician recommends that you continue on your current medications as directed. Please refer to the Current Medication list given to you today.  Labwork: None ordered.  Testing/Procedures: None ordered.  Follow-Up: Your physician wants you to follow-up in: one year with Dr. Lovena Le.   You will receive a reminder letter in the mail two months in advance. If you don't receive a letter, please call our office to schedule the follow-up appointment.  Remote monitoring is used to monitor your Pacemaker from home. This monitoring reduces the number of office visits required to check your device to one time per year. It allows [place] to keep an eye on the functioning of your device to ensure it is working properly. You are scheduled for a device check from home on 05/25/2017. You may send your transmission at any time that day. If you have a wireless device, the transmission will be sent automatically. After your physician reviews your transmission, you will receive a postcard with your next transmission date.   Any Other Special Instructions Will Be Listed Below (If Applicable).  If you need a refill on your cardiac medications before your next appointment, please call your pharmacy.

## 2017-02-23 LAB — CUP PACEART INCLINIC DEVICE CHECK
Battery Impedance: 1194 Ohm
Battery Voltage: 2.77 V
Brady Statistic RV Percent Paced: 19 %
Implantable Lead Implant Date: 20090506
Implantable Lead Location: 753859
Implantable Lead Location: 753860
Implantable Lead Model: 4024
Implantable Pulse Generator Implant Date: 20090506
Lead Channel Impedance Value: 586 Ohm
Lead Channel Impedance Value: 67 Ohm
Lead Channel Pacing Threshold Amplitude: 0.75 V
Lead Channel Pacing Threshold Pulse Width: 0.4 ms
Lead Channel Setting Sensing Sensitivity: 4 mV
MDC IDC LEAD IMPLANT DT: 20090506
MDC IDC MSMT BATTERY REMAINING LONGEVITY: 66 mo
MDC IDC MSMT LEADCHNL RV PACING THRESHOLD AMPLITUDE: 0.875 V
MDC IDC MSMT LEADCHNL RV PACING THRESHOLD PULSEWIDTH: 0.4 ms
MDC IDC MSMT LEADCHNL RV SENSING INTR AMPL: 8 mV
MDC IDC SESS DTM: 20181221145331
MDC IDC SET LEADCHNL RV PACING AMPLITUDE: 2.5 V
MDC IDC SET LEADCHNL RV PACING PULSEWIDTH: 0.4 ms

## 2017-03-30 ENCOUNTER — Ambulatory Visit (HOSPITAL_COMMUNITY): Payer: Medicare Other | Attending: Cardiology

## 2017-03-30 ENCOUNTER — Other Ambulatory Visit: Payer: Medicare Other | Admitting: *Deleted

## 2017-03-30 ENCOUNTER — Other Ambulatory Visit: Payer: Self-pay

## 2017-03-30 DIAGNOSIS — I5042 Chronic combined systolic (congestive) and diastolic (congestive) heart failure: Secondary | ICD-10-CM | POA: Diagnosis not present

## 2017-03-30 DIAGNOSIS — I071 Rheumatic tricuspid insufficiency: Secondary | ICD-10-CM | POA: Diagnosis not present

## 2017-03-30 LAB — BASIC METABOLIC PANEL
BUN/Creatinine Ratio: 16 (ref 10–24)
BUN: 24 mg/dL (ref 8–27)
CALCIUM: 9.4 mg/dL (ref 8.6–10.2)
CO2: 23 mmol/L (ref 20–29)
Chloride: 103 mmol/L (ref 96–106)
Creatinine, Ser: 1.47 mg/dL — ABNORMAL HIGH (ref 0.76–1.27)
GFR, EST AFRICAN AMERICAN: 51 mL/min/{1.73_m2} — AB (ref >59)
GFR, EST NON AFRICAN AMERICAN: 44 mL/min/{1.73_m2} — AB (ref >59)
Glucose: 102 mg/dL — ABNORMAL HIGH (ref 65–99)
Potassium: 4.7 mmol/L (ref 3.5–5.2)
Sodium: 141 mmol/L (ref 134–144)

## 2017-04-01 NOTE — Progress Notes (Signed)
Cardiology Office Note    Date:  04/02/2017   ID:  Timothy Harper, DOB 01-10-1933, MRN [id number]  PCP:  Lavone Orn, MD  Cardiologist: Sinclair Grooms, MD   Chief Complaint  Patient presents with  . Coronary Artery Disease  . Congestive Heart Failure    History of Present Illness:  Timothy Harper is a 84 y.o. male with history of coronary artery disease with prior coronary bypass grafting and native vessel PCI, tachybradycardia syndrome with atrial fibrillation, hypertension, embolic CVA, and chronic anticoagulation therapy.  He feels well.  He is slightly dizzy when he stands and therefore has to hesitate up to 5-10 seconds before moving forward.  He denies orthopnea, PND, and swelling.  No chest discomfort.  Is seen Dr. Lovena Le in late December who commented that he is not a good candidate for ICD due to advanced age.   Past Medical History:  Diagnosis Date  . 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 Atrial fibrillation ([place])   . BPH (benign prostatic hyperplasia)   . Cardiac pacemaker in situ   . Chronic atrial fibrillation ([place])   . Coronary artery disease involving coronary bypass graft 1994   LIMA to RCA . Prior PCI on RCA with restenosis  . CVA (cerebral infarction) 1995  . CVA (cerebral infarction)    Right  . Erectile dysfunction   . GERD (gastroesophageal reflux disease)   . Hearing loss    hearing aids  . Herpes zoster   . HTN (hypertension)   . Hyperlipemia   . Hypothyroidism   . Long term (current) use of anticoagulants   . Melanoma [facility])    right ear, Rosana Hoes 2002 and left arm 1997, [place]  . Primary male hypogonadism     Past Surgical History:  Procedure Laterality Date  . RIGHT/LEFT HEART CATH AND CORONARY/GRAFT ANGIOGRAPHY N/A 08/26/2016   08/26/16- Patent bypass grafts    Current Medications: Outpatient Medications Prior to Visit  Medication Sig Dispense Refill  . acetaminophen (TYLENOL) 500 MG tablet Take  1,000 mg by mouth 2 (two) times daily as needed for moderate pain or headache.    Marland Kitchen aspirin 325 MG tablet Take 325 mg by mouth daily as needed (leg cramps).     Marland Kitchen aspirin-sod bicarb-citric acid (ALKA-SELTZER) 325 MG TBEF tablet Take 325 mg by mouth daily as needed (acid reflux).    Marland Kitchen atorvastatin (LIPITOR) 20 MG tablet Take 20 mg by mouth at bedtime.     . carvedilol (COREG) 25 MG tablet Take 1 tablet (25 mg total) by mouth 2 (two) times daily. 180 tablet 3  . Cholecalciferol (VITAMIN D3) 1000 units CAPS Take 1,000 Units by mouth daily.    . colchicine 0.6 MG tablet Take 0.6 mg by mouth daily as needed (gout pain). On hold as of 10/08/16    . DENTA 5000 PLUS 1.1 % CREA dental cream Apply 1 application topically at bedtime.     . digoxin (LANOXIN) 0.125 MG tablet Take 1 tablet (0.125 mg total) by mouth every Monday, Wednesday, and Friday. 45 tablet 3  . levothyroxine (SYNTHROID, LEVOTHROID) 112 MCG tablet Take 112 mcg by mouth daily before breakfast.    . Magnesium 250 MG TABS Take 250 mg by mouth daily.    . sacubitril-valsartan (ENTRESTO) 24-26 MG Take 1 tablet by mouth 2 (two) times daily. 60 tablet 11  . sildenafil (VIAGRA) 100 MG tablet Take 100 mg by mouth daily  as needed for erectile dysfunction.    . tamsulosin (FLOMAX) 0.4 MG CAPS capsule Take 0.4 mg by mouth every evening.     . Testosterone (FORTESTA) 10 MG/ACT (2%) GEL Place onto the skin. 4 pumps daily    . ULORIC 40 MG tablet Take 40 mg by mouth daily.    . vardenafil (LEVITRA) 20 MG tablet Take 20 mg by mouth daily as needed for erectile dysfunction.    Marland Kitchen warfarin (COUMADIN) 5 MG tablet Take 5 mg by mouth daily. Take 5mg s by mouth daily except fridays dont take any warfarin    . furosemide (LASIX) 40 MG tablet Take 40 mg by mouth daily.    . potassium chloride SA (K-DUR,KLOR-CON) 20 MEQ tablet Take 20 mEq by mouth daily.     No facility-administered medications prior to visit.      Allergies:   Penicillins and Prednisone    Social History   Socioeconomic History  . Marital status: Married    Spouse name: None  . Number of children: 4  . Years of education: None  . Highest education level: None  Social Needs  . Financial resource strain: None  . Food insecurity - worry: None  . Food insecurity - inability: None  . Transportation needs - medical: None  . Transportation needs - non-medical: None  Occupational History  . Occupation: Music therapist    Comment: retired  Tobacco Use  . Smoking status: Former Smoker    Last attempt to quit: 08/01/1988    Years since quitting: 28.6  . Smokeless tobacco: Never Used  Substance and Sexual Activity  . Alcohol use: Yes    Comment: 2-3 times a week  . Drug use: No  . Sexual activity: None  Other Topics Concern  . None  Social History Narrative  . None     Family History:  The patient's family history includes Heart disease in his mother.   ROS:   Please see the history of present illness.    Decreased memory and hearing.  Aggravated by frequent urination on diuretic therapy. All other systems reviewed and are negative.   PHYSICAL EXAM:   VS:  BP (!) 84/52   Pulse 63   Ht 5\' 11"  (1.803 m)   Wt 221 lb 9.6 oz (100.5 kg)   BMI 30.91 kg/m    GEN: Well nourished, well developed, in no acute distress  HEENT: normal  Neck: no JVD, carotid bruits, or masses Cardiac: IIRR; no murmurs, rubs, or gallops,no edema  Respiratory:  clear to auscultation bilaterally, normal work of breathing GI: soft, nontender, nondistended, + BS MS: no deformity or atrophy  Skin: warm and dry, no rash Neuro:  Alert and Oriented x 3, Strength and sensation are intact Psych: euthymic mood, full affect  Wt Readings from Last 3 Encounters:  04/02/17 221 lb 9.6 oz (100.5 kg)  02/20/17 224 lb (101.6 kg)  12/25/16 218 lb 12.8 oz (99.2 kg)      Studies/Labs Reviewed:   EKG:  EKG  Not done  Recent Labs: 07/09/2016: NT-Pro BNP 1,025; TSH 2.800 08/20/2016: ALT 61;  Hemoglobin 15.1; Platelets 225 03/30/2017: BUN 24; Creatinine, Ser 1.47; Potassium 4.7; Sodium 141   Lipid Panel No results found for: CHOL, TRIG, HDL, CHOLHDL, VLDL, LDLCALC, LDLDIRECT  Additional studies/ records that were reviewed today include:  2D Doppler echocardiogram 03/30/2017: Study Conclusions   - Left ventricle: The cavity size was normal. There was mild   concentric hypertrophy. Systolic function was moderately  to   severely reduced. The estimated ejection fraction was in the   range of 30% to 35%. Diffuse hypokinesis. - Ventricular septum: Septal motion showed paradox. - Left atrium: The atrium was severely dilated. - Right ventricle: The cavity size was moderately dilated. Wall   thickness was normal. Systolic function was moderately reduced. - Right atrium: The atrium was severely dilated. Pacer wire or   catheter noted in right atrium. - Tricuspid valve: There was mild regurgitation. - Inferior vena cava: The vessel was dilated. The respirophasic   diameter changes were blunted (< 50%), consistent with elevated   central venous pressure. - Pericardium, extracardiac: There was no pericardial effusion.   Impressions:   - There is no significant difference from the prior study on   07/23/2016, LVEF remains severely depressed at 30-35% with diffuse   hypokinesis and paradoxical septal motion.   RVEF is moderately reduced. There is severe bi-atrial dilatation.      ASSESSMENT:    1. Chronic combined systolic and diastolic heart failure ([place])   2. Chronic atrial fibrillation ([facility])   3. Hypotension due to drugs   4. Coronary artery disease involving coronary bypass graft of native heart with angina pectoris ([place])   5. Essential hypertension   6. Sinus node dysfunction ([facility])   7. Pacemaker   8. Encounter for therapeutic drug monitoring      PLAN:  In order of problems listed above:  1. EF still less than 35%.  Not a candidate for AICD because of advanced age.   On strong heart failure therapy including Entresto, beta-blocker max dose, and furosemide.  Because of low blood pressures will decrease furosemide to 20 mg/day.  He will need to weigh himself meticulously and notify us if shortness of breath or weight gain. 2. No change in therapy.  Good rate control.  Bradycardia cannot occur because of pacemaker. 3. Entresto, max dose carvedilol, diuretic therapy, are contributing to hypotension.  Since he is euvolemic will cut back on diuretic therapy to furosemide 20 mg/day and monitor.  Basic metabolic panel and BNP and 10 days.  K Dur is cut back to 10 mEq/day.  Clinical follow-up in 6 weeks. 4. Asymptomatic with reference to symptomatic angina. 5. See hypotension above. 6. Permanent pacemaker functioning normally. 7. See above. 8. No bleeding on anticoagulation.  We discussed defibrillator therapy.  He pointed out to me that he had had this conversation with Dr. Lovena Le who felt that ICD was not a good idea.  Will decrease diuretic regimen to allow slightly higher blood pressure.  Will monitor closely to exclude recurrent volume overload.  6-week follow-up.    Medication Adjustments/Labs and Tests Ordered: Current medicines are reviewed at length with the patient today.  Concerns regarding medicines are outlined above.  Medication changes, Labs and Tests ordered today are listed in the Patient Instructions below. There are no Patient Instructions on file for this visit.   Signed, Sinclair Grooms, MD  04/02/2017 10:47 AM    [facility] [address], [place], [place]  [address] Phone: [phone]; Fax: [phone]

## 2017-04-02 ENCOUNTER — Ambulatory Visit: Payer: Medicare Other | Admitting: Interventional Cardiology

## 2017-04-02 ENCOUNTER — Encounter: Payer: Self-pay | Admitting: Interventional Cardiology

## 2017-04-02 VITALS — BP 84/52 | HR 63 | Ht 71.0 in | Wt 221.6 lb

## 2017-04-02 DIAGNOSIS — I952 Hypotension due to drugs: Secondary | ICD-10-CM | POA: Diagnosis not present

## 2017-04-02 DIAGNOSIS — I10 Essential (primary) hypertension: Secondary | ICD-10-CM

## 2017-04-02 DIAGNOSIS — I495 Sick sinus syndrome: Secondary | ICD-10-CM

## 2017-04-02 DIAGNOSIS — Z950 Presence of cardiac pacemaker: Secondary | ICD-10-CM

## 2017-04-02 DIAGNOSIS — I25709 Atherosclerosis of coronary artery bypass graft(s), unspecified, with unspecified angina pectoris: Secondary | ICD-10-CM | POA: Diagnosis not present

## 2017-04-02 DIAGNOSIS — I482 Chronic atrial fibrillation, unspecified: Secondary | ICD-10-CM

## 2017-04-02 DIAGNOSIS — I5042 Chronic combined systolic (congestive) and diastolic (congestive) heart failure: Secondary | ICD-10-CM

## 2017-04-02 DIAGNOSIS — Z5181 Encounter for therapeutic drug level monitoring: Secondary | ICD-10-CM

## 2017-04-02 MED ORDER — POTASSIUM CHLORIDE ER 10 MEQ PO TBCR: 10.0000 meq | EXTENDED_RELEASE_TABLET | Freq: Every day | ORAL | 3 refills | Status: DC

## 2017-04-02 MED ORDER — FUROSEMIDE 20 MG PO TABS: 20.0000 mg | ORAL_TABLET | Freq: Every day | ORAL | 3 refills | Status: DC

## 2017-04-02 NOTE — Patient Instructions (Signed)
Medication Instructions:  1) DECREASE Furosemide to 20mg  once daily 2) DECREASE Potassium to 48mEq once daily  Labwork: Your physician recommends that you return for lab work in: 2 weeks (BMET, Pro BNP)   Testing/Procedures: None  Follow-Up: Your physician recommends that you schedule a follow-up appointment in: 6 weeks with Dr. Tamala Julian.    Any Other Special Instructions Will Be Listed Below (If Applicable).  Make sure you are weighing daily.  Make sure you have on the same amount of clothing each time and are weighing at the same time of day.    If you need a refill on your cardiac medications before your next appointment, please call your pharmacy.

## 2017-04-16 ENCOUNTER — Other Ambulatory Visit: Payer: Medicare Other | Admitting: *Deleted

## 2017-04-16 DIAGNOSIS — I5042 Chronic combined systolic (congestive) and diastolic (congestive) heart failure: Secondary | ICD-10-CM

## 2017-04-16 LAB — BASIC METABOLIC PANEL
BUN / CREAT RATIO: 11 (ref 10–24)
BUN: 15 mg/dL (ref 8–27)
CHLORIDE: 106 mmol/L (ref 96–106)
CO2: 21 mmol/L (ref 20–29)
Calcium: 9.3 mg/dL (ref 8.6–10.2)
Creatinine, Ser: 1.41 mg/dL — ABNORMAL HIGH (ref 0.76–1.27)
GFR calc non Af Amer: 46 mL/min/{1.73_m2} — ABNORMAL LOW (ref >59)
GFR, EST AFRICAN AMERICAN: 53 mL/min/{1.73_m2} — AB (ref >59)
Glucose: 142 mg/dL — ABNORMAL HIGH (ref 65–99)
POTASSIUM: 4.8 mmol/L (ref 3.5–5.2)
SODIUM: 145 mmol/L — AB (ref 134–144)

## 2017-04-16 LAB — PRO B NATRIURETIC PEPTIDE: NT-Pro BNP: 504 pg/mL — ABNORMAL HIGH (ref 0–486)

## 2017-04-20 NOTE — Progress Notes (Signed)
Pt has been made aware of normal result and to continue on the lower dose of 20 mg Lasix.  Pt verbalized understanding.  jw

## 2017-05-12 NOTE — Progress Notes (Signed)
Cardiology Office Note    Date:  05/13/2017   ID:  Timothy Harper, DOB [birth year], MRN [id number]  PCP:  Lavone Orn, MD  Cardiologist: Sinclair Grooms, MD   Chief Complaint  Patient presents with  . Congestive Heart Failure  . Coronary Artery Disease  . Atrial Fibrillation    History of Present Illness:  Timothy Harper is a 84 y.o. male with history of coronary artery disease with prior coronary bypass grafting and native vessel PCI, tachybradycardia syndrome with atrial fibrillation, hypertension, embolic CVA, and chronic anticoagulation therapy.  Overall doing relatively well.  He is back to monitor whether or not decreasing the diuretic regimen has caused him to be more short of breath or any untoward effects.  He generally feels good.  He is starting to exercise.   Past Medical History:  Diagnosis Date  . 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 Atrial fibrillation ([place])   . BPH (benign prostatic hyperplasia)   . Cardiac pacemaker in situ   . Chronic atrial fibrillation ([place])   . Coronary artery disease involving coronary bypass graft 1994   LIMA to RCA . Prior PCI on RCA with restenosis  . CVA (cerebral infarction) 1995  . CVA (cerebral infarction)    Right  . Erectile dysfunction   . GERD (gastroesophageal reflux disease)   . Hearing loss    hearing aids  . Herpes zoster   . HTN (hypertension)   . Hyperlipemia   . Hypothyroidism   . Long term (current) use of anticoagulants   . Melanoma [facility])    right ear, Rosana Hoes 2002 and left arm 1997, [place]  . Primary male hypogonadism     Past Surgical History:  Procedure Laterality Date  . RIGHT/LEFT HEART CATH AND CORONARY/GRAFT ANGIOGRAPHY N/A 08/26/2016   08/26/16- Patent bypass grafts    Current Medications: Outpatient Medications Prior to Visit  Medication Sig Dispense Refill  . acetaminophen (TYLENOL) 500 MG tablet Take 1,000 mg by mouth 2 (two) times daily as needed for  moderate pain or headache.    Marland Kitchen aspirin 325 MG tablet Take 325 mg by mouth daily as needed (leg cramps).     Marland Kitchen aspirin-sod bicarb-citric acid (ALKA-SELTZER) 325 MG TBEF tablet Take 325 mg by mouth daily as needed (acid reflux).    Marland Kitchen atorvastatin (LIPITOR) 20 MG tablet Take 20 mg by mouth at bedtime.     . carvedilol (COREG) 25 MG tablet Take 1 tablet (25 mg total) by mouth 2 (two) times daily. 180 tablet 3  . Cholecalciferol (VITAMIN D3) 1000 units CAPS Take 1,000 Units by mouth daily.    . colchicine 0.6 MG tablet Take 0.6 mg by mouth daily as needed (gout pain). On hold as of 10/08/16    . DENTA 5000 PLUS 1.1 % CREA dental cream Apply 1 application topically at bedtime.     . digoxin (LANOXIN) 0.125 MG tablet Take 1 tablet (0.125 mg total) by mouth every Monday, Wednesday, and Friday. 45 tablet 3  . furosemide (LASIX) 20 MG tablet Take 1 tablet (20 mg total) by mouth daily. 90 tablet 3  . levothyroxine (SYNTHROID, LEVOTHROID) 112 MCG tablet Take 112 mcg by mouth daily before breakfast.    . Magnesium 250 MG TABS Take 250 mg by mouth daily.    . potassium chloride (K-DUR) 10 MEQ tablet Take 1 tablet (10 mEq total) by mouth daily. 90 tablet 3  . sacubitril-valsartan (ENTRESTO)  24-26 MG Take 1 tablet by mouth 2 (two) times daily. 60 tablet 11  . sildenafil (VIAGRA) 100 MG tablet Take 100 mg by mouth daily as needed for erectile dysfunction.    . tamsulosin (FLOMAX) 0.4 MG CAPS capsule Take 0.4 mg by mouth every evening.     . Testosterone (FORTESTA) 10 MG/ACT (2%) GEL Place onto the skin. 4 pumps daily    . ULORIC 40 MG tablet Take 40 mg by mouth daily.    . vardenafil (LEVITRA) 20 MG tablet Take 20 mg by mouth daily as needed for erectile dysfunction.    Marland Kitchen warfarin (COUMADIN) 5 MG tablet Take 5 mg by mouth daily. Take 5mg s by mouth daily except fridays dont take any warfarin     No facility-administered medications prior to visit.      Allergies:   Penicillins and Prednisone   Social History    Socioeconomic History  . Marital status: Married    Spouse name: None  . Number of children: 4  . Years of education: None  . Highest education level: None  Social Needs  . Financial resource strain: None  . Food insecurity - worry: None  . Food insecurity - inability: None  . Transportation needs - medical: None  . Transportation needs - non-medical: None  Occupational History  . Occupation: Music therapist    Comment: retired  Tobacco Use  . Smoking status: Former Smoker    Last attempt to quit: 08/01/1988    Years since quitting: 28.8  . Smokeless tobacco: Never Used  Substance and Sexual Activity  . Alcohol use: Yes    Comment: 2-3 times a week  . Drug use: No  . Sexual activity: None  Other Topics Concern  . None  Social History Narrative  . None     Family History:  The patient's family history includes Heart disease in his mother.   ROS:   Please see the history of present illness.    None  All other systems reviewed and are negative.   PHYSICAL EXAM:   VS:  BP 98/66   Pulse 90   Ht 5\' 11"  (1.803 m)   Wt 224 lb 12.8 oz (102 kg)   BMI 31.35 kg/m    GEN: Well nourished, well developed, in no acute distress  HEENT: normal  Neck: no JVD, carotid bruits, or masses Cardiac: IIRR; no murmurs, rubs, or gallops,no edema  Respiratory:  clear to auscultation bilaterally, normal work of breathing GI: soft, nontender, nondistended, + BS MS: no deformity or atrophy  Skin: warm and dry, no rash Neuro:  Alert and Oriented x 3, Strength and sensation are intact Psych: euthymic mood, full affect  Wt Readings from Last 3 Encounters:  05/13/17 224 lb 12.8 oz (102 kg)  04/02/17 221 lb 9.6 oz (100.5 kg)  02/20/17 224 lb (101.6 kg)      Studies/Labs Reviewed:   EKG:  EKG  Not done  Recent Labs: 07/09/2016: TSH 2.800 08/20/2016: ALT 61; Hemoglobin 15.1; Platelets 225 04/16/2017: BUN 15; Creatinine, Ser 1.41; NT-Pro BNP 504; Potassium 4.8; Sodium 145   Lipid  Panel No results found for: CHOL, TRIG, HDL, CHOLHDL, VLDL, LDLCALC, LDLDIRECT  Additional studies/ records that were reviewed today include:  January 2019 2D Doppler echocardiogram: Study Conclusions   - Left ventricle: The cavity size was normal. There was mild   concentric hypertrophy. Systolic function was moderately to   severely reduced. The estimated ejection fraction was in the   range  of 30% to 35%. Diffuse hypokinesis. - Ventricular septum: Septal motion showed paradox. - Left atrium: The atrium was severely dilated. - Right ventricle: The cavity size was moderately dilated. Wall   thickness was normal. Systolic function was moderately reduced. - Right atrium: The atrium was severely dilated. Pacer wire or   catheter noted in right atrium. - Tricuspid valve: There was mild regurgitation. - Inferior vena cava: The vessel was dilated. The respirophasic   diameter changes were blunted (< 50%), consistent with elevated   central venous pressure. - Pericardium, extracardiac: There was no pericardial effusion.   Impressions:   - There is no significant difference from the prior study on   07/23/2016, LVEF remains severely depressed at 30-35% with diffuse   hypokinesis and paradoxical septal motion.   RVEF is moderately reduced. There is severe bi-atrial dilatation.      ASSESSMENT:    1. Chronic combined systolic and diastolic heart failure ([place])   2. Chronic atrial fibrillation (H[facility])   3. Coronary artery disease involving coronary bypass graft of native heart with angina pectoris ([place])   4. Sinus node dysfunction ([facility])   5. Essential hypertension   6. Pacemaker      PLAN:  In order of problems listed above:  1. Symptomatically improved.  EF less than 35%.  Not a candidate for AICD.  Not able to further titrate therapy because of low blood pressures.  Plan clinical follow-up with APP in 4-6 months and with me in 9-12 months.  Should have basic metabolic panel on next  office visit. 2. Continue Coumadin therapy for stroke prophylaxis. 3. Risk factor modification with LDL less than 70, hemoglobin A1c less than 7, blood pressure target less than 130/80 mmHg.   4-6 months with APP 6-9 months with me.    Medication Adjustments/Labs and Tests Ordered: Current medicines are reviewed at length with the patient today.  Concerns regarding medicines are outlined above.  Medication changes, Labs and Tests ordered today are listed in the Patient Instructions below. There are no Patient Instructions on file for this visit.   Signed, Sinclair Grooms, MD  05/13/2017 10:21 AM    [facility] [address], [place], [place]  [address] Phone: [phone]; Fax: [phone]

## 2017-05-13 ENCOUNTER — Ambulatory Visit: Payer: Medicare Other | Admitting: Interventional Cardiology

## 2017-05-13 ENCOUNTER — Encounter: Payer: Self-pay | Admitting: Interventional Cardiology

## 2017-05-13 VITALS — BP 98/66 | HR 90 | Ht 71.0 in | Wt 224.8 lb

## 2017-05-13 DIAGNOSIS — I495 Sick sinus syndrome: Secondary | ICD-10-CM | POA: Diagnosis not present

## 2017-05-13 DIAGNOSIS — I5042 Chronic combined systolic (congestive) and diastolic (congestive) heart failure: Secondary | ICD-10-CM

## 2017-05-13 DIAGNOSIS — I25709 Atherosclerosis of coronary artery bypass graft(s), unspecified, with unspecified angina pectoris: Secondary | ICD-10-CM

## 2017-05-13 DIAGNOSIS — I482 Chronic atrial fibrillation, unspecified: Secondary | ICD-10-CM

## 2017-05-13 DIAGNOSIS — Z950 Presence of cardiac pacemaker: Secondary | ICD-10-CM

## 2017-05-13 DIAGNOSIS — I10 Essential (primary) hypertension: Secondary | ICD-10-CM

## 2017-05-13 NOTE — Patient Instructions (Signed)
Medication Instructions:  Your physician recommends that you continue on your current medications as directed. Please refer to the Current Medication list given to you today.  Labwork: None  Testing/Procedures: None  Follow-Up: Your physician wants you to follow-up in: 6 months with Dr. Tamala Julian or PA or NP. You will receive a reminder letter in the mail two months in advance. If you don't receive a letter, please call our office to schedule the follow-up appointment.   Any Other Special Instructions Will Be Listed Below (If Applicable).     If you need a refill on your cardiac medications before your next appointment, please call your pharmacy.

## 2017-05-25 ENCOUNTER — Ambulatory Visit (INDEPENDENT_AMBULATORY_CARE_PROVIDER_SITE_OTHER): Payer: Medicare Other | Admitting: *Deleted

## 2017-05-25 ENCOUNTER — Telehealth: Payer: Self-pay | Admitting: Cardiology

## 2017-05-25 DIAGNOSIS — I495 Sick sinus syndrome: Secondary | ICD-10-CM | POA: Diagnosis not present

## 2017-05-25 NOTE — Telephone Encounter (Signed)
Spoke with pt and reminded pt of remote transmission that is due today. Pt verbalized understanding.   

## 2017-05-26 ENCOUNTER — Encounter: Payer: Self-pay | Admitting: Cardiology

## 2017-05-26 ENCOUNTER — Telehealth: Payer: Self-pay

## 2017-05-26 LAB — CUP PACEART REMOTE DEVICE CHECK
Battery Impedance: 1248 Ohm
Battery Remaining Longevity: 65 mo
Brady Statistic RV Percent Paced: 11 %
Implantable Lead Implant Date: 20090506
Implantable Lead Location: 753860
Implantable Lead Model: 4024
Lead Channel Impedance Value: 571 Ohm
Lead Channel Impedance Value: 67 Ohm
Lead Channel Setting Pacing Amplitude: 2.5 V
Lead Channel Setting Pacing Pulse Width: 0.4 ms
Lead Channel Setting Sensing Sensitivity: 4 mV
MDC IDC LEAD IMPLANT DT: 20090506
MDC IDC LEAD LOCATION: 753859
MDC IDC MSMT BATTERY VOLTAGE: 2.76 V
MDC IDC MSMT LEADCHNL RV PACING THRESHOLD AMPLITUDE: 0.875 V
MDC IDC MSMT LEADCHNL RV PACING THRESHOLD PULSEWIDTH: 0.4 ms
MDC IDC PG IMPLANT DT: 20090506
MDC IDC SESS DTM: 20190325215708

## 2017-05-26 NOTE — Progress Notes (Signed)
Remote pacemaker transmission.   

## 2017-05-26 NOTE — Telephone Encounter (Signed)
LVM for pt to return call to device clinic to discuss possible symptoms with AF w/ RVR seen on remote transmission.

## 2017-05-29 NOTE — Telephone Encounter (Signed)
error 

## 2017-08-24 ENCOUNTER — Ambulatory Visit (INDEPENDENT_AMBULATORY_CARE_PROVIDER_SITE_OTHER): Payer: Medicare Other | Admitting: *Deleted

## 2017-08-24 DIAGNOSIS — I495 Sick sinus syndrome: Secondary | ICD-10-CM | POA: Diagnosis not present

## 2017-08-24 NOTE — Progress Notes (Signed)
Remote pacemaker transmission.   

## 2017-08-25 ENCOUNTER — Encounter: Payer: Self-pay | Admitting: Cardiology

## 2017-08-27 LAB — CUP PACEART REMOTE DEVICE CHECK
Battery Impedance: 1305 Ohm
Brady Statistic RV Percent Paced: 10 %
Implantable Lead Implant Date: 20090506
Implantable Lead Model: 4024
Lead Channel Impedance Value: 553 Ohm
Lead Channel Impedance Value: 67 Ohm
Lead Channel Setting Pacing Pulse Width: 0.4 ms
Lead Channel Setting Sensing Sensitivity: 4 mV
MDC IDC LEAD IMPLANT DT: 20090506
MDC IDC LEAD LOCATION: 753859
MDC IDC LEAD LOCATION: 753860
MDC IDC MSMT BATTERY REMAINING LONGEVITY: 63 mo
MDC IDC MSMT BATTERY VOLTAGE: 2.76 V
MDC IDC MSMT LEADCHNL RV PACING THRESHOLD AMPLITUDE: 0.875 V
MDC IDC MSMT LEADCHNL RV PACING THRESHOLD PULSEWIDTH: 0.4 ms
MDC IDC PG IMPLANT DT: 20090506
MDC IDC SESS DTM: 20190624134348
MDC IDC SET LEADCHNL RV PACING AMPLITUDE: 2.5 V

## 2017-09-24 ENCOUNTER — Other Ambulatory Visit: Payer: Self-pay | Admitting: Cardiology

## 2017-09-24 DIAGNOSIS — I5042 Chronic combined systolic (congestive) and diastolic (congestive) heart failure: Secondary | ICD-10-CM

## 2017-11-06 ENCOUNTER — Other Ambulatory Visit: Payer: Self-pay | Admitting: Interventional Cardiology

## 2017-11-08 NOTE — Progress Notes (Signed)
Cardiology Office Note:    Date:  11/09/2017   ID:  Timothy Harper, DOB [birth year], MRN [id number]  PCP:  Lavone Orn, MD  Cardiologist:  Sinclair Grooms, MD   Referring MD: Lavone Orn, MD   Chief Complaint  Patient presents with  . Atrial Fibrillation    History of Present Illness:    Timothy Harper is a 84 y.o. male with a hx of with history of coronary artery disease with prior coronary bypass grafting and native vessel PCI, tachybradycardia syndrome with atrial fibrillation, hypertension, embolic CVA, and chronic anticoagulation therapy.  Timothy Harper has stopped walking.  He states this occurred because of climate/weather conditions-hot and humid.  He denies angina.  He is not playing golf.  He denies orthopnea and PND.  There is been no lower extremity swelling.  He denies palpitations and syncope.  Past Medical History:  Diagnosis Date  . 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 Atrial fibrillation ([place])   . BPH (benign prostatic hyperplasia)   . Cardiac pacemaker in situ   . Chronic atrial fibrillation ([place])   . Coronary artery disease involving coronary bypass graft 1994   LIMA to RCA . Prior PCI on RCA with restenosis  . CVA (cerebral infarction) 1995  . CVA (cerebral infarction)    Right  . Erectile dysfunction   . GERD (gastroesophageal reflux disease)   . Hearing loss    hearing aids  . Herpes zoster   . HTN (hypertension)   . Hyperlipemia   . Hypothyroidism   . Long term (current) use of anticoagulants   . Melanoma [facility])    right ear, Rosana Hoes 2002 and left arm 1997, [place]  . Primary male hypogonadism     Past Surgical History:  Procedure Laterality Date  . RIGHT/LEFT HEART CATH AND CORONARY/GRAFT ANGIOGRAPHY N/A 08/26/2016   08/26/16- Patent bypass grafts    Current Medications: Current Meds  Medication Sig  . acetaminophen (TYLENOL) 500 MG tablet Take 1,000 mg by mouth 2 (two) times daily as needed for moderate pain  or headache.  Marland Kitchen aspirin 325 MG tablet Take 325 mg by mouth daily as needed (leg cramps).   Marland Kitchen aspirin-sod bicarb-citric acid (ALKA-SELTZER) 325 MG TBEF tablet Take 325 mg by mouth daily as needed (acid reflux).  Marland Kitchen atorvastatin (LIPITOR) 20 MG tablet Take 20 mg by mouth at bedtime.   . carvedilol (COREG) 25 MG tablet TAKE 1 TABLET BY MOUTH TWICE DAILY.  Marland Kitchen Cholecalciferol (VITAMIN D3) 1000 units CAPS Take 1,000 Units by mouth daily.  . colchicine 0.6 MG tablet Take 0.6 mg by mouth daily as needed (gout pain). On hold as of 10/08/16  . DENTA 5000 PLUS 1.1 % CREA dental cream Apply 1 application topically at bedtime.   . digoxin (LANOXIN) 0.125 MG tablet Take 0.125 mg by mouth 3 (three) times a week. (Mondays, Wednesdays, and Fridays)  . ENTRESTO 24-26 MG TAKE 1 TABLET BY MOUTH TWICE DAILY.  . furosemide (LASIX) 20 MG tablet Take 20 mg by mouth daily.  Marland Kitchen levothyroxine (SYNTHROID, LEVOTHROID) 112 MCG tablet Take 112 mcg by mouth daily before breakfast.  . Magnesium 250 MG TABS Take 250 mg by mouth daily.  . potassium chloride (K-DUR,KLOR-CON) 10 MEQ tablet Take 10 mEq by mouth daily.  . sildenafil (VIAGRA) 100 MG tablet Take 100 mg by mouth daily as needed for erectile dysfunction.  . tamsulosin (FLOMAX) 0.4 MG CAPS capsule Take 0.4 mg by mouth  every evening.   . Testosterone (FORTESTA) 10 MG/ACT (2%) GEL Place onto the skin. 4 pumps daily  . ULORIC 40 MG tablet Take 40 mg by mouth daily.  . vardenafil (LEVITRA) 20 MG tablet Take 20 mg by mouth daily as needed for erectile dysfunction.  Marland Kitchen warfarin (COUMADIN) 5 MG tablet Take 5 mg by mouth daily. Take 5mg s by mouth daily except fridays dont take any warfarin     Allergies:   Penicillins and Prednisone   Social History   Socioeconomic History  . Marital status: Married    Spouse name: Not on file  . Number of children: 4  . Years of education: Not on file  . Highest education level: Not on file  Occupational History  . Occupation: Education officer, community    Comment: retired  Scientific laboratory technician  . Financial resource strain: Not on file  . Food insecurity:    Worry: Not on file    Inability: Not on file  . Transportation needs:    Medical: Not on file    Non-medical: Not on file  Tobacco Use  . Smoking status: Former Smoker    Last attempt to quit: 08/01/1988    Years since quitting: 29.2  . Smokeless tobacco: Never Used  Substance and Sexual Activity  . Alcohol use: Yes    Comment: 2-3 times a week  . Drug use: No  . Sexual activity: Not on file  Lifestyle  . Physical activity:    Days per week: Not on file    Minutes per session: Not on file  . Stress: Not on file  Relationships  . Social connections:    Talks on phone: Not on file    Gets together: Not on file    Attends religious service: Not on file    Active member of club or organization: Not on file    Attends meetings of clubs or organizations: Not on file    Relationship status: Not on file  Other Topics Concern  . Not on file  Social History Narrative  . Not on file     Family History: The patient's family history includes Heart disease in his mother.  ROS:   Please see the history of present illness.    Decreased stamina.  Feels occasional palpitation.  All other systems reviewed and are negative.  EKGs/Labs/Other Studies Reviewed:    The following studies were reviewed today: None  EKG:  EKG is  ordered today.  The ekg ordered today demonstrates atrial fibrillation with intermittent ventricular pacing.  No change compared to prior.  Average heart rate on tracing is 81 bpm.  Recent Labs: 04/16/2017: BUN 15; Creatinine, Ser 1.41; NT-Pro BNP 504; Potassium 4.8; Sodium 145  Recent Lipid Panel No results found for: CHOL, TRIG, HDL, CHOLHDL, VLDL, LDLCALC, LDLDIRECT  Physical Exam:    VS:  BP (!) 88/60   Pulse 81   Ht 5\' 11"  (1.803 m)   Wt 222 lb 3.2 oz (100.8 kg)   BMI 30.99 kg/m     Wt Readings from Last 3 Encounters:  11/09/17 222 lb 3.2 oz  (100.8 kg)  05/13/17 224 lb 12.8 oz (102 kg)  04/02/17 221 lb 9.6 oz (100.5 kg)     GEN:  Well nourished, well developed in no acute distress HEENT: Normal NECK: No JVD. LYMPHATICS: No lymphadenopathy CARDIAC: IIRR, nomurmur, nogallop, no edema. VASCULAR: Plus bilateral radial pulses.  No bruits. RESPIRATORY:  Clear to auscultation without rales, wheezing or rhonchi  ABDOMEN: Soft, non-tender, non-distended, No pulsatile mass, MUSCULOSKELETAL: No deformity  SKIN: Warm and dry NEUROLOGIC:  Alert and oriented x 3 PSYCHIATRIC:  Normal affect   ASSESSMENT:    1. Chronic atrial fibrillation ([place])   2. Chronic combined systolic and diastolic heart failure ([place])   3. Coronary artery disease involving native coronary artery of native heart without angina pectoris   4. Essential hypertension   5. Cardiac pacemaker in situ    PLAN:    In order of problems listed above:  1. Chronic atrial fibrillation with adequate rate control. 2. No evidence of volume overload on current heart failure therapy which includes Entresto and max dose carvedilol.  Consider discontinuation of Lasix and starting spironolactone or eplerenone.  Perhaps this can be implemented on the next visit.  Would recommend 25 mg daily of Spironolactone.  Obviously supplemental potassium may need to be discontinued and serum potassium levels will need to be followed closely after initiation. 3. Asymptomatic from standpoint of ischemic symptoms. 4. Relatively low blood pressure due to heart failure therapy.  No current changes recommended. 5. Normally functioning pacemaker.  Clinical follow-up in 4 months with team member and with me in 8 months.  At some point over the next 6 to 8 months we will see if transitioning from a loop diuretic to a mineralocorticoid receptor antagonist there is possible with adjustment in potassium intake.  Encouraged him to resume physical activity attempting to achieve 150 minutes of moderate aerobic  activity per week.   Medication Adjustments/Labs and Tests Ordered: Current medicines are reviewed at length with the patient today.  Concerns regarding medicines are outlined above.  Orders Placed This Encounter  Procedures  . EKG 12-Lead   No orders of the defined types were placed in this encounter.   There are no Patient Instructions on file for this visit.   Signed, Sinclair Grooms, MD  11/09/2017 10:24 AM    [place]

## 2017-11-09 ENCOUNTER — Ambulatory Visit: Payer: Medicare Other | Admitting: Interventional Cardiology

## 2017-11-09 ENCOUNTER — Encounter: Payer: Self-pay | Admitting: Interventional Cardiology

## 2017-11-09 VITALS — BP 88/60 | HR 81 | Ht 71.0 in | Wt 222.2 lb

## 2017-11-09 DIAGNOSIS — I10 Essential (primary) hypertension: Secondary | ICD-10-CM | POA: Diagnosis not present

## 2017-11-09 DIAGNOSIS — I5042 Chronic combined systolic (congestive) and diastolic (congestive) heart failure: Secondary | ICD-10-CM | POA: Diagnosis not present

## 2017-11-09 DIAGNOSIS — I482 Chronic atrial fibrillation, unspecified: Secondary | ICD-10-CM

## 2017-11-09 DIAGNOSIS — I2510 Atherosclerotic heart disease of native coronary artery without angina pectoris: Secondary | ICD-10-CM

## 2017-11-09 DIAGNOSIS — Z950 Presence of cardiac pacemaker: Secondary | ICD-10-CM

## 2017-11-09 NOTE — Patient Instructions (Signed)
Medication Instructions:  Your physician recommends that you continue on your current medications as directed. Please refer to the Current Medication list given to you today.   Labwork: none  Testing/Procedures: none  Follow-Up: Your physician wants you to follow-up in: 4 months with NP or PA and 8-10 months with Dr. Gaspar Bidding will receive a reminder letter in the mail two months in advance. If you don't receive a letter, please call our office to schedule the follow-up appointment.   Any Other Special Instructions Will Be Listed Below (If Applicable).     If you need a refill on your cardiac medications before your next appointment, please call your pharmacy.

## 2017-11-23 ENCOUNTER — Telehealth: Payer: Self-pay | Admitting: Cardiology

## 2017-11-23 ENCOUNTER — Ambulatory Visit (INDEPENDENT_AMBULATORY_CARE_PROVIDER_SITE_OTHER): Payer: Medicare Other | Admitting: *Deleted

## 2017-11-23 DIAGNOSIS — I495 Sick sinus syndrome: Secondary | ICD-10-CM | POA: Diagnosis not present

## 2017-11-23 NOTE — Progress Notes (Signed)
Remote pacemaker transmission.   

## 2017-11-23 NOTE — Telephone Encounter (Signed)
Attempted to confirm remote transmission with pt. No answer and was unable to leave a message.   

## 2017-11-24 ENCOUNTER — Encounter: Payer: Self-pay | Admitting: Cardiology

## 2017-12-11 LAB — CUP PACEART REMOTE DEVICE CHECK
Battery Impedance: 1387 Ohm
Battery Remaining Longevity: 60 mo
Date Time Interrogation Session: 20190923165316
Implantable Lead Implant Date: 20090506
Implantable Lead Model: 4024
Lead Channel Setting Sensing Sensitivity: 4 mV
MDC IDC LEAD IMPLANT DT: 20090506
MDC IDC LEAD LOCATION: 753859
MDC IDC LEAD LOCATION: 753860
MDC IDC MSMT BATTERY VOLTAGE: 2.76 V
MDC IDC MSMT LEADCHNL RA IMPEDANCE VALUE: 67 Ohm
MDC IDC MSMT LEADCHNL RV IMPEDANCE VALUE: 588 Ohm
MDC IDC MSMT LEADCHNL RV PACING THRESHOLD AMPLITUDE: 0.875 V
MDC IDC MSMT LEADCHNL RV PACING THRESHOLD PULSEWIDTH: 0.4 ms
MDC IDC PG IMPLANT DT: 20090506
MDC IDC SET LEADCHNL RV PACING AMPLITUDE: 2.5 V
MDC IDC SET LEADCHNL RV PACING PULSEWIDTH: 0.4 ms
MDC IDC STAT BRADY RV PERCENT PACED: 10 %

## 2017-12-21 ENCOUNTER — Other Ambulatory Visit: Payer: Self-pay | Admitting: Interventional Cardiology

## 2017-12-21 DIAGNOSIS — I5042 Chronic combined systolic (congestive) and diastolic (congestive) heart failure: Secondary | ICD-10-CM

## 2017-12-21 IMAGING — CR DG FOOT COMPLETE 3+V*R*
3 series · 3 of 3 positions shown · non-contrast
Comparison: Right ankle series of today's date

CLINICAL DATA: Pain and swelling of the foot. History of gout and
unspecified arthritis.

EXAM:
RIGHT FOOT COMPLETE - 3+ VIEW

[t foot ap right]
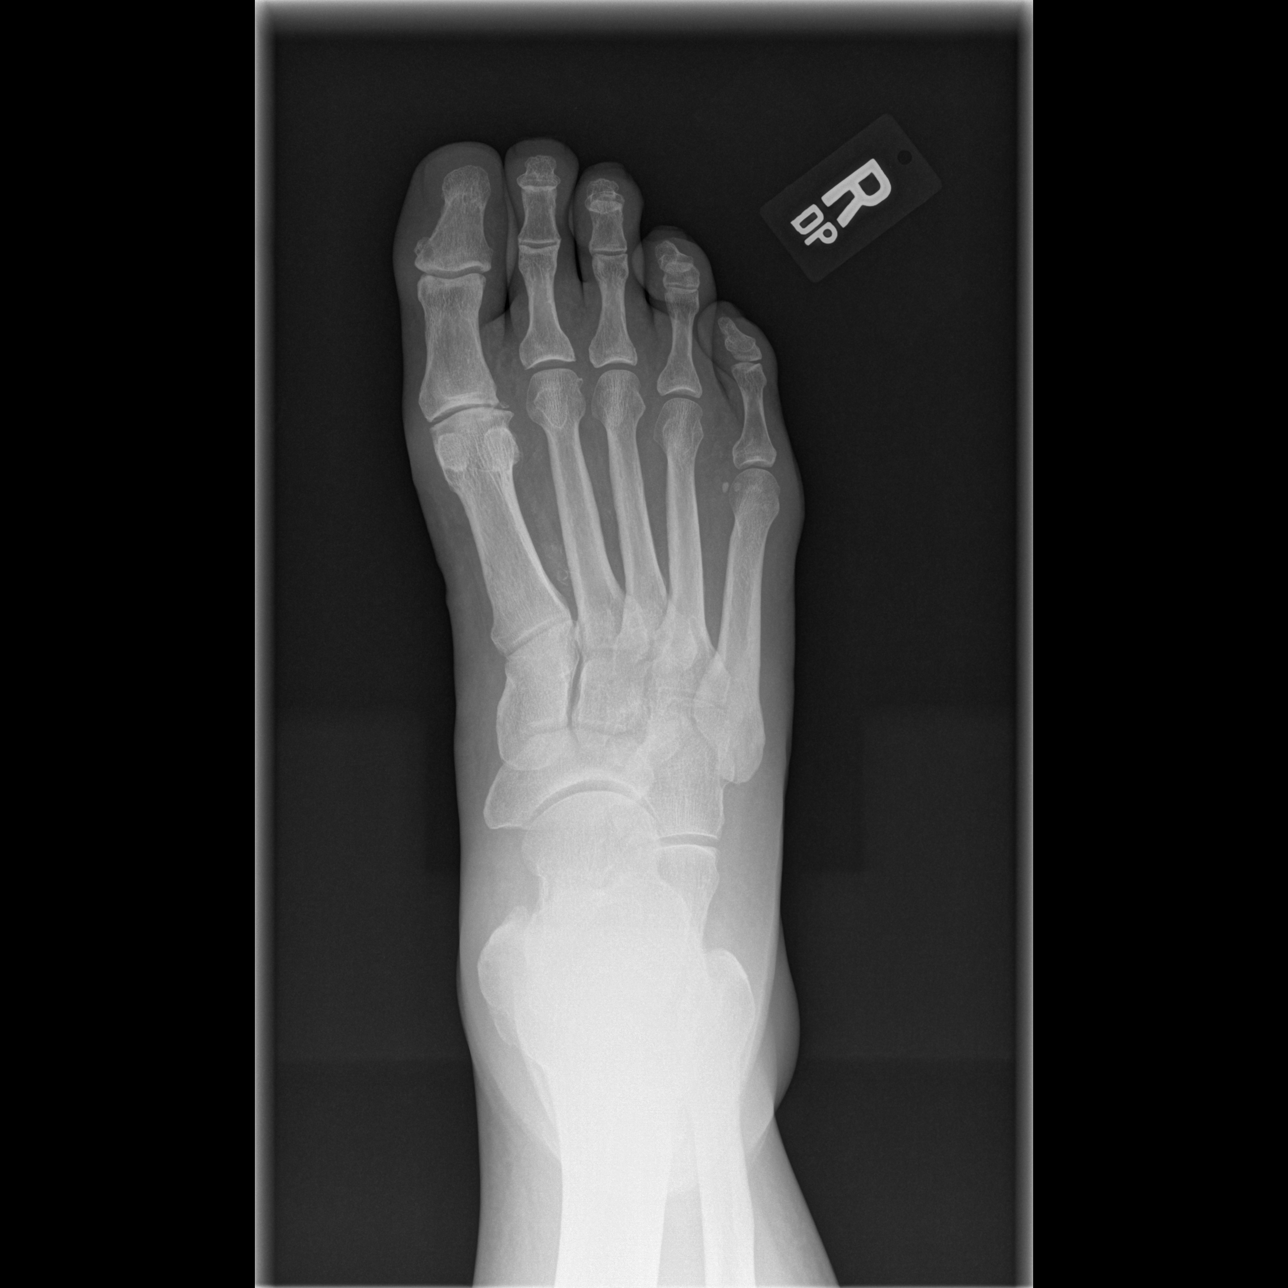

[t foot oblique right]
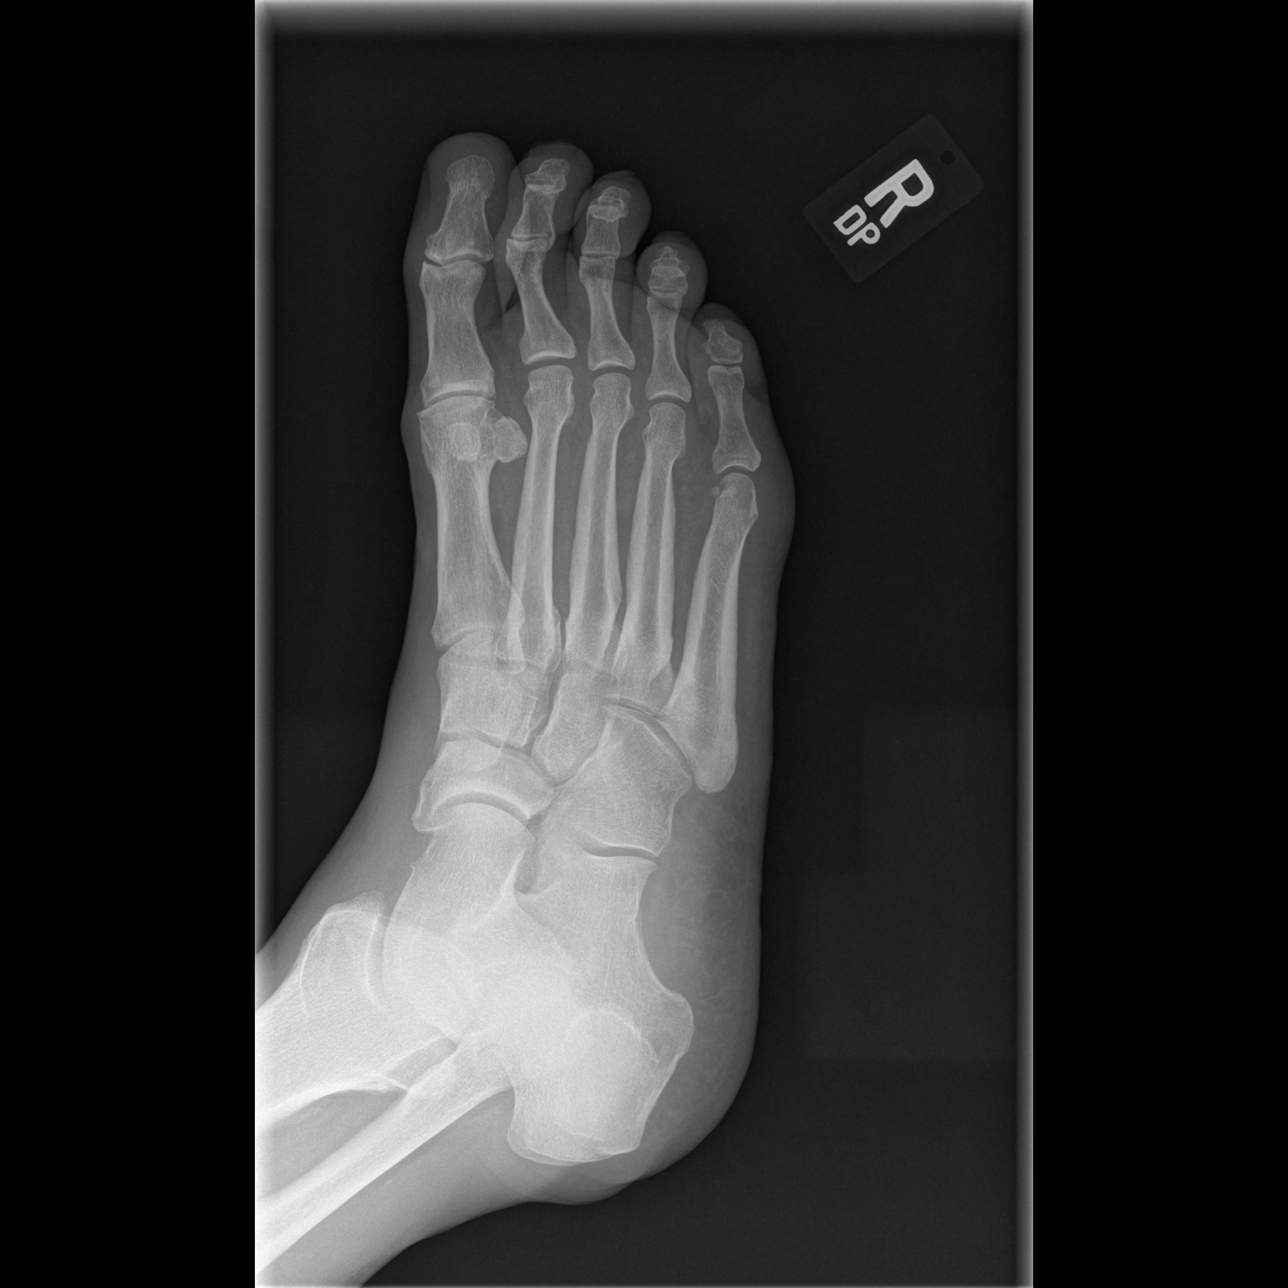

[t foot lat right]
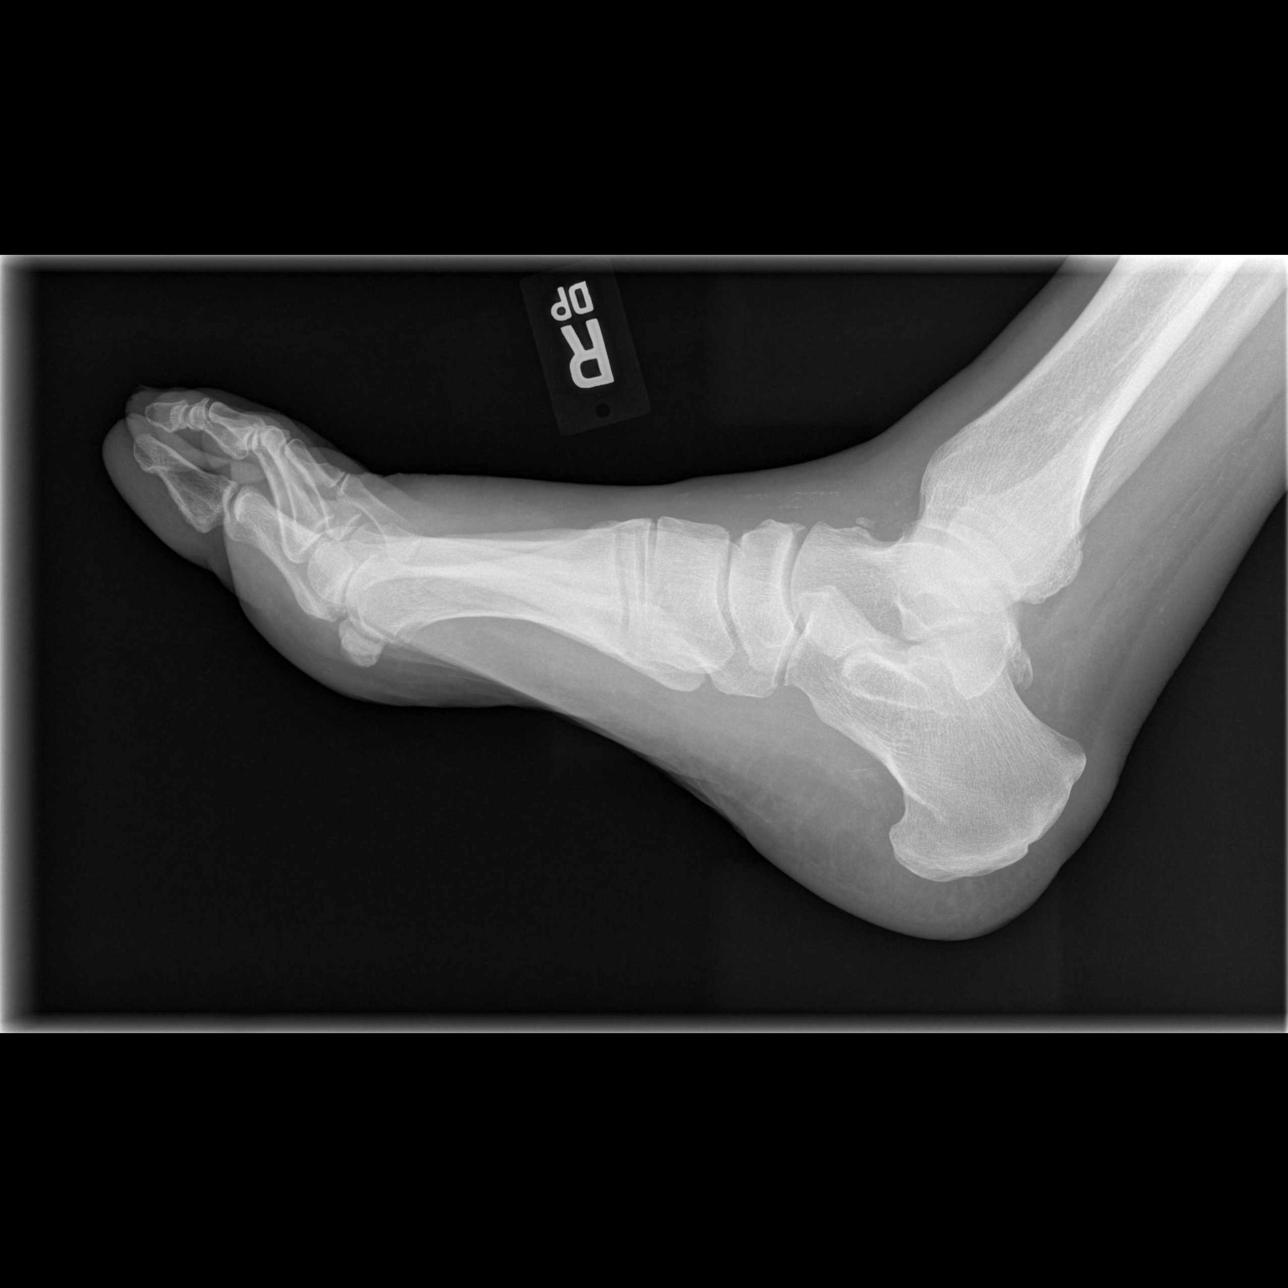

[3 of 3 positions shown; findings below may reference images not displayed]

FINDINGS: The bones are subjectively adequately mineralized. There is mild
narrowing of the first MTP joint. The other MTP joints as well as
all interphalangeal joints are grossly normal. The tarsometatarsal
and intertarsal joint spaces are preserved. The patient has
sustained previous avulsion from the dorsum of the anterior aspect
of the talus. A small spurs noted over the dorsal aspect of the
proximal pole of the tarsal navicular. The soft tissues exhibit mild
fullness dorsally. There are vascular calcifications.
IMPRESSION: There are degenerative changes of the first MTP joint. No abnormal
soft tissue calcifications or erosive changes are demonstrated.

## 2018-02-26 ENCOUNTER — Encounter: Payer: Self-pay | Admitting: Cardiology

## 2018-03-08 ENCOUNTER — Ambulatory Visit (INDEPENDENT_AMBULATORY_CARE_PROVIDER_SITE_OTHER): Payer: Medicare Other

## 2018-03-08 DIAGNOSIS — I495 Sick sinus syndrome: Secondary | ICD-10-CM

## 2018-03-08 DIAGNOSIS — I4891 Unspecified atrial fibrillation: Secondary | ICD-10-CM | POA: Diagnosis not present

## 2018-03-09 ENCOUNTER — Encounter: Payer: Self-pay | Admitting: Nurse Practitioner

## 2018-03-09 ENCOUNTER — Ambulatory Visit: Payer: Medicare Other | Admitting: Nurse Practitioner

## 2018-03-09 VITALS — BP 120/80 | HR 83 | Ht 71.0 in | Wt 221.8 lb

## 2018-03-09 DIAGNOSIS — I10 Essential (primary) hypertension: Secondary | ICD-10-CM | POA: Diagnosis not present

## 2018-03-09 DIAGNOSIS — I482 Chronic atrial fibrillation, unspecified: Secondary | ICD-10-CM

## 2018-03-09 DIAGNOSIS — I5042 Chronic combined systolic (congestive) and diastolic (congestive) heart failure: Secondary | ICD-10-CM | POA: Diagnosis not present

## 2018-03-09 DIAGNOSIS — I2510 Atherosclerotic heart disease of native coronary artery without angina pectoris: Secondary | ICD-10-CM | POA: Diagnosis not present

## 2018-03-09 NOTE — Patient Instructions (Addendum)
We will be checking the following labs today - BMET & CBC   Medication Instructions:    Continue with your current medicines.    If you need a refill on your cardiac medications before your next appointment, please call your pharmacy.     Testing/Procedures To Be Arranged:  N/A  Follow-Up:   See Dr. Tamala Julian as planned in May.   See Dr. Lovena Le later this month as planned.     At [facility], you and your health needs are our priority.  As part of our continuing mission to provide you with exceptional heart care, we have created designated Provider Care Teams.  These Care Teams include your primary Cardiologist (physician) and Advanced Practice Providers (APPs -  Physician Assistants and Nurse Practitioners) who all work together to provide you with the care you need, when you need it.  Special Instructions:  . Try to get back to walking . Try to restrict your salt a little more  Call the [place] office at [phone] if you have any questions, problems or concerns.

## 2018-03-09 NOTE — Progress Notes (Signed)
CARDIOLOGY OFFICE NOTE  Date:  03/09/2018    Timothy Harper Date of Birth: Feb 06, 1933 Medical Record #[id number]  PCP:  Lavone Orn, MD  Cardiologist:  Jennings Books    Chief Complaint  Patient presents with  . Congestive Heart Failure    Follow up visit - seen for Dr. Tamala Julian    History of Present Illness: Timothy Harper is a 85 y.o. male who presents today for a 4 month check. Seen for Dr. Tamala Julian. .  He has a hx of coronary artery disease with prior coronary bypass grafting per EBG approximately in 1994 and native vessel PCI, tachybradycardia syndrome with chronic atrial fibrillation, underlying PPM, hypertension, embolic CVA, and chronic anticoagulation therapy.    Last seen here in September by Dr. Tamala Julian - had stopped his walking due to weather being too hot. Otherwise doing ok. He wanted to consider starting Aldactone if possible.    Comes in today. Here alone. He says he is doing ok. He went back to walking, then stopped again, has not started back. Has some fatigue. Breathing is short with exertion. He feels lightheaded in the AM. Probably not restricting his salt very much. Seems a little mixed up about his medicines to me and with just general conversation and somewhat repetitive. Coumadin managed by PCP. No bleeding/bruising. I do not see a recent CBC. He says overall he is at his baseline and has no concern.   Past Medical History:  Diagnosis Date  . 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 Atrial fibrillation ([place])   . BPH (benign prostatic hyperplasia)   . Cardiac pacemaker in situ   . Chronic atrial fibrillation   . Coronary artery disease involving coronary bypass graft 1994   LIMA to RCA . Prior PCI on RCA with restenosis  . CVA (cerebral infarction) 1995  . CVA (cerebral infarction)    Right  . Erectile dysfunction   . GERD (gastroesophageal reflux disease)   . Hearing loss    hearing aids  . Herpes zoster   . HTN  (hypertension)   . Hyperlipemia   . Hypothyroidism   . Long term (current) use of anticoagulants   . Melanoma [facility])    right ear, Rosana Hoes 2002 and left arm 1997, [place]  . Primary male hypogonadism     Past Surgical History:  Procedure Laterality Date  . RIGHT/LEFT HEART CATH AND CORONARY/GRAFT ANGIOGRAPHY N/A 08/26/2016   08/26/16- Patent bypass grafts     Medications: Current Meds  Medication Sig  . acetaminophen (TYLENOL) 500 MG tablet Take 1,000 mg by mouth 2 (two) times daily as needed for moderate pain or headache.  Marland Kitchen aspirin 325 MG tablet Take 325 mg by mouth daily as needed (leg cramps).   Marland Kitchen aspirin-sod bicarb-citric acid (ALKA-SELTZER) 325 MG TBEF tablet Take 325 mg by mouth daily as needed (acid reflux).  Marland Kitchen atorvastatin (LIPITOR) 20 MG tablet Take 20 mg by mouth at bedtime.   . carvedilol (COREG) 25 MG tablet TAKE 1 TABLET BY MOUTH TWICE DAILY.  Marland Kitchen Cholecalciferol (VITAMIN D3) 1000 units CAPS Take 1,000 Units by mouth daily.  . colchicine 0.6 MG tablet Take 0.6 mg by mouth daily as needed (gout pain). On hold as of 10/08/16  . DENTA 5000 PLUS 1.1 % CREA dental cream Apply 1 application topically at bedtime.   . digoxin (LANOXIN) 0.125 MG tablet Take 0.125 mg by mouth 3 (three) times a week. (Mondays,  Wednesdays, and Fridays)  . ENTRESTO 24-26 MG TAKE 1 TABLET BY MOUTH TWICE DAILY.  . furosemide (LASIX) 20 MG tablet Take 20 mg by mouth daily.  Marland Kitchen levothyroxine (SYNTHROID, LEVOTHROID) 112 MCG tablet Take 112 mcg by mouth daily before breakfast.  . Magnesium 250 MG TABS Take 250 mg by mouth daily.  . potassium chloride (K-DUR,KLOR-CON) 10 MEQ tablet Take 10 mEq by mouth daily.  . sildenafil (VIAGRA) 100 MG tablet Take 100 mg by mouth daily as needed for erectile dysfunction.  . tamsulosin (FLOMAX) 0.4 MG CAPS capsule Take 0.4 mg by mouth every evening.   . Testosterone (FORTESTA) 10 MG/ACT (2%) GEL Place onto the skin. 4 pumps daily  . ULORIC 40 MG tablet Take 40 mg by  mouth daily.  . vardenafil (LEVITRA) 20 MG tablet Take 20 mg by mouth daily as needed for erectile dysfunction.  Marland Kitchen warfarin (COUMADIN) 5 MG tablet Take 5 mg by mouth daily. Take 5mg s by mouth daily except fridays dont take any warfarin     Allergies: Allergies  Allergen Reactions  . Penicillins     Swollen face/lips Has patient had a PCN reaction causing immediate rash, facial/tongue/throat swelling, SOB or lightheadedness with hypotension: Yes Has patient had a PCN reaction causing severe rash involving mucus membranes or skin necrosis: No Has patient had a PCN reaction that required hospitalization: No Has patient had a PCN reaction occurring within the last 10 years: No If all of the above answers are "NO", then may proceed with Cephalosporin use.   . Prednisone     swelling    Social History: The patient  reports that he quit smoking about 29 years ago. He has never used smokeless tobacco. He reports current alcohol use. He reports that he does not use drugs.   Family History: The patient's family history includes Heart disease in his mother.   Review of Systems: Please see the history of present illness.   Otherwise, the review of systems is positive for none.   All other systems are reviewed and negative.   Physical Exam: VS:  BP 120/80 (BP Location: Left Arm, Patient Position: Sitting, Cuff Size: Normal)   Pulse 83   Ht 5\' 11"  (1.803 m)   Wt 221 lb 12.8 oz (100.6 kg)   SpO2 100%   BMI 30.93 kg/m  .  BMI Body mass index is 30.93 kg/m.  Wt Readings from Last 3 Encounters:  03/09/18 221 lb 12.8 oz (100.6 kg)  11/09/17 222 lb 3.2 oz (100.8 kg)  05/13/17 224 lb 12.8 oz (102 kg)    General: Pleasant. Elderly male. Alert and in no acute distress.   HEENT: Normal. Little hard of hearing.  Neck: Supple, no JVD, carotid bruits, or masses noted.  Cardiac: Irregular irregular rhythm. Rate is ok. Heart tones are distant. No edema.  Respiratory:  Lungs are clear to  auscultation bilaterally with normal work of breathing.  GI: Soft and nontender.  MS: No deformity or atrophy. Gait and ROM intact.  Skin: Warm and dry. Color is normal.  Neuro:  Strength and sensation are intact and no gross focal deficits noted.  Psych: Alert, appropriate and with normal affect.   LABORATORY DATA:  EKG:  EKG is not ordered today.  Lab Results  Component Value Date   WBC 6.2 08/20/2016   HGB 15.1 08/20/2016   HCT 43.2 08/20/2016   PLT 225 08/20/2016   GLUCOSE 142 (H) 04/16/2017   ALT 61 (H) 08/20/2016   AST  39 08/20/2016   NA 145 (H) 04/16/2017   K 4.8 04/16/2017   CL 106 04/16/2017   CREATININE 1.41 (H) 04/16/2017   BUN 15 04/16/2017   CO2 21 04/16/2017   TSH 2.800 07/09/2016   INR 2.0 09/05/2016       BNP (last 3 results) No results for input(s): BNP in the last 8760 hours.  ProBNP (last 3 results) Recent Labs    04/16/17 0902  PROBNP 504*     Other Studies Reviewed Today:  Echo Study Conclusions 03/2017  - Left ventricle: The cavity size was normal. There was mild   concentric hypertrophy. Systolic function was moderately to   severely reduced. The estimated ejection fraction was in the   range of 30% to 35%. Diffuse hypokinesis. - Ventricular septum: Septal motion showed paradox. - Left atrium: The atrium was severely dilated. - Right ventricle: The cavity size was moderately dilated. Wall   thickness was normal. Systolic function was moderately reduced. - Right atrium: The atrium was severely dilated. Pacer wire or   catheter noted in right atrium. - Tricuspid valve: There was mild regurgitation. - Inferior vena cava: The vessel was dilated. The respirophasic   diameter changes were blunted (< 50%), consistent with elevated   central venous pressure. - Pericardium, extracardiac: There was no pericardial effusion.  Impressions:  - There is no significant difference from the prior study on   07/23/2016, LVEF remains severely  depressed at 30-35% with diffuse   hypokinesis and paradoxical septal motion.   RVEF is moderately reduced. There is severe bi-atrial dilatation.   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  Patent right internal mammary graft to the distal RCA. 70% obstruction in the second left ventricular branch of the distal right coronary  Widely patent left main  Widely patent proximal to mid LAD stent. Proximal and mid Ernie percent narrowing within the LAD.  Widely patent circumflex coronary artery.  Upper normal right heart pressures with mildly elevated right atrial pressure. Normal mean pulmonary capillary wedge pressure of 13 mmHg.  Dilated and globally hypocontractile left ventricle. LVEF 25-30%. Normal LVEDP at 11 mmHg. Findings are compatible with chronic compensated systolic heart failure.   RECOMMENDATIONS:   Medical therapy and optimization  We will need electrophysiology opinion concerning possible AICD, although currently on medical therapy the patient is well compensated and may not be needed.  Resume Coumadin and Lovenox today as scheduled by pharmacy. First dose of Lovenox should be in a.m.     Assessment & Plan:  1. Persistent AF - managed with rate control and anticoagulation - he needs follow up lab today.   2. Chronic combined systolic and diastolic HF - we talked about trying Aldactone - he seemed to get more mixed up with this discussion the more we talked about. Then endorses more lightheadedness - BP recheck by me is only 108/70 - I have elected to leave him on his current regimen for now.   3. HTN - recheck is soft - no changes made.   4. Underlying PPM - seeing EP later this month.   5. CAD - remote CABG - no active chest pain. Encouraged him to get back to walking.   Current medicines are reviewed with the  patient today.  The patient does not have concerns regarding medicines other than what has been noted above.  The  following changes have been made:  See above.  Labs/ tests ordered today include:    Orders Placed This Encounter  Procedures  . Basic metabolic panel  . CBC     Disposition:   FU with Dr. Lovena Le and Dr. Tamala Julian as planned.  Patient is agreeable to this plan and will call if any problems develop in the interim.   SignedTruitt Merle, NP  03/09/2018 3:37 PM  [place] [address] [place], [place]  [address] Phone: [phone] Fax: [phone]

## 2018-03-09 NOTE — Progress Notes (Signed)
Remote pacemaker transmission.   

## 2018-03-10 LAB — CUP PACEART REMOTE DEVICE CHECK
Battery Impedance: 1445 Ohm
Battery Remaining Longevity: 59 mo
Battery Voltage: 2.76 V
Brady Statistic RV Percent Paced: 9 %
Date Time Interrogation Session: 20200104160812
Implantable Lead Implant Date: 20090506
Implantable Lead Location: 753859
Implantable Lead Model: 4024
Implantable Pulse Generator Implant Date: 20090506
Lead Channel Impedance Value: 547 Ohm
Lead Channel Impedance Value: 67 Ohm
Lead Channel Pacing Threshold Pulse Width: 0.4 ms
Lead Channel Setting Pacing Amplitude: 2.5 V
Lead Channel Setting Pacing Pulse Width: 0.4 ms
Lead Channel Setting Sensing Sensitivity: 2.8 mV
MDC IDC LEAD IMPLANT DT: 20090506
MDC IDC LEAD LOCATION: 753860
MDC IDC MSMT LEADCHNL RV PACING THRESHOLD AMPLITUDE: 0.875 V

## 2018-03-10 LAB — BASIC METABOLIC PANEL
BUN/Creatinine Ratio: 14 (ref 10–24)
BUN: 19 mg/dL (ref 8–27)
CO2: 24 mmol/L (ref 20–29)
Calcium: 9.4 mg/dL (ref 8.6–10.2)
Chloride: 101 mmol/L (ref 96–106)
Creatinine, Ser: 1.34 mg/dL — ABNORMAL HIGH (ref 0.76–1.27)
GFR calc Af Amer: 56 mL/min/{1.73_m2} — ABNORMAL LOW (ref >59)
GFR calc non Af Amer: 49 mL/min/{1.73_m2} — ABNORMAL LOW (ref >59)
Glucose: 88 mg/dL (ref 65–99)
Potassium: 4.7 mmol/L (ref 3.5–5.2)
Sodium: 141 mmol/L (ref 134–144)

## 2018-03-10 LAB — CBC
Hematocrit: 44.3 % (ref 37.5–51.0)
Hemoglobin: 15.2 g/dL (ref 13.0–17.7)
MCH: 31.6 pg (ref 26.6–33.0)
MCHC: 34.3 g/dL (ref 31.5–35.7)
MCV: 92 fL (ref 79–97)
Platelets: 237 10*3/uL (ref 150–450)
RBC: 4.81 x10E6/uL (ref 4.14–5.80)
RDW: 13.6 % (ref 11.6–15.4)
WBC: 7 10*3/uL (ref 3.4–10.8)

## 2018-03-15 ENCOUNTER — Ambulatory Visit: Payer: Medicare Other | Admitting: Internal Medicine

## 2018-03-15 ENCOUNTER — Encounter: Payer: Self-pay | Admitting: Internal Medicine

## 2018-03-15 VITALS — BP 102/62 | HR 89 | Ht 71.0 in | Wt 223.0 lb

## 2018-03-15 DIAGNOSIS — I495 Sick sinus syndrome: Secondary | ICD-10-CM

## 2018-03-15 DIAGNOSIS — I482 Chronic atrial fibrillation, unspecified: Secondary | ICD-10-CM | POA: Diagnosis not present

## 2018-03-15 DIAGNOSIS — Z950 Presence of cardiac pacemaker: Secondary | ICD-10-CM | POA: Diagnosis not present

## 2018-03-15 NOTE — Progress Notes (Signed)
HPI [name] returns today for ongoing evaluation and management of his PPM. He has an ICM, chronic class 2 CHF, atrial fib, and dyslipidemia. he is still walking but has symptoms as above.  Allergies  Allergen Reactions  . Penicillins     Swollen face/lips Has patient had a PCN reaction causing immediate rash, facial/tongue/throat swelling, SOB or lightheadedness with hypotension: Yes Has patient had a PCN reaction causing severe rash involving mucus membranes or skin necrosis: No Has patient had a PCN reaction that required hospitalization: No Has patient had a PCN reaction occurring within the last 10 years: No If all of the above answers are "NO", then may proceed with Cephalosporin use.   . Prednisone     swelling     Current Outpatient Medications  Medication Sig Dispense Refill  . acetaminophen (TYLENOL) 500 MG tablet Take 1,000 mg by mouth 2 (two) times daily as needed for moderate pain or headache.    Marland Kitchen aspirin 325 MG tablet Take 325 mg by mouth daily as needed (leg cramps).     Marland Kitchen aspirin-sod bicarb-citric acid (ALKA-SELTZER) 325 MG TBEF tablet Take 325 mg by mouth daily as needed (acid reflux).    Marland Kitchen atorvastatin (LIPITOR) 20 MG tablet Take 20 mg by mouth at bedtime.     . carvedilol (COREG) 25 MG tablet TAKE 1 TABLET BY MOUTH TWICE DAILY. 180 tablet 3  . Cholecalciferol (VITAMIN D3) 1000 units CAPS Take 1,000 Units by mouth daily.    . colchicine 0.6 MG tablet Take 0.6 mg by mouth daily as needed (gout pain). On hold as of 10/08/16    . DENTA 5000 PLUS 1.1 % CREA dental cream Apply 1 application topically at bedtime.     . digoxin (LANOXIN) 0.125 MG tablet Take 0.125 mg by mouth 3 (three) times a week. (Mondays, Wednesdays, and Fridays)    . ENTRESTO 24-26 MG TAKE 1 TABLET BY MOUTH TWICE DAILY. 180 tablet 2  . furosemide (LASIX) 20 MG tablet Take 20 mg by mouth daily.    Marland Kitchen levothyroxine (SYNTHROID, LEVOTHROID) 112 MCG tablet Take 112 mcg by mouth daily before  breakfast.    . Magnesium 250 MG TABS Take 250 mg by mouth daily.    . potassium chloride (K-DUR,KLOR-CON) 10 MEQ tablet Take 10 mEq by mouth daily.    . sildenafil (VIAGRA) 100 MG tablet Take 100 mg by mouth daily as needed for erectile dysfunction.    . tamsulosin (FLOMAX) 0.4 MG CAPS capsule Take 0.4 mg by mouth every evening.     . Testosterone (FORTESTA) 10 MG/ACT (2%) GEL Place onto the skin. 4 pumps daily    . ULORIC 40 MG tablet Take 40 mg by mouth daily.    . vardenafil (LEVITRA) 20 MG tablet Take 20 mg by mouth daily as needed for erectile dysfunction.    Marland Kitchen warfarin (COUMADIN) 5 MG tablet Take 5 mg by mouth daily. Take 5mg s by mouth daily except fridays dont take any warfarin     No current facility-administered medications for this visit.      Past Medical History:  Diagnosis Date  . 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 Atrial fibrillation ([place])   . BPH (benign prostatic hyperplasia)   . Cardiac pacemaker in situ   . Chronic atrial fibrillation   . Coronary artery disease involving coronary bypass graft 1994   LIMA to RCA . Prior PCI on RCA with restenosis  .  CVA (cerebral infarction) 1995  . CVA (cerebral infarction)    Right  . Erectile dysfunction   . GERD (gastroesophageal reflux disease)   . Hearing loss    hearing aids  . Herpes zoster   . HTN (hypertension)   . Hyperlipemia   . Hypothyroidism   . Long term (current) use of anticoagulants   . Melanoma [facility])    right ear, Rosana Hoes 2002 and left arm 1997, [place]  . Primary male hypogonadism     ROS:   All systems reviewed and negative except as noted in the HPI.   Past Surgical History:  Procedure Laterality Date  . RIGHT/LEFT HEART CATH AND CORONARY/GRAFT ANGIOGRAPHY N/A 08/26/2016   08/26/16- Patent bypass grafts     Family History  Problem Relation Age of Onset  . Heart disease Mother      Social History   Socioeconomic History  . Marital status: Married    Spouse  name: Not on file  . Number of children: 4  . Years of education: Not on file  . Highest education level: Not on file  Occupational History  . 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 Financial resource strain: Not on file  . Food insecurity:    Worry: Not on file    Inability: Not on file  . Transportation needs:    Medical: Not on file    Non-medical: Not on file  Tobacco Use  . Smoking status: Former Smoker    Last attempt to quit: 08/01/1988    Years since quitting: 29.6  . Smokeless tobacco: Never Used  Substance and Sexual Activity  . Alcohol use: Yes    Comment: 2-3 times a week  . Drug use: No  . Sexual activity: Not on file  Lifestyle  . Physical activity:    Days per week: Not on file    Minutes per session: Not on file  . Stress: Not on file  Relationships  . Social connections:    Talks on phone: Not on file    Gets together: Not on file    Attends religious service: Not on file    Active member of club or organization: Not on file    Attends meetings of clubs or organizations: Not on file    Relationship status: Not on file  . Intimate partner violence:    Fear of current or ex partner: Not on file    Emotionally abused: Not on file    Physically abused: Not on file    Forced sexual activity: Not on file  Other Topics Concern  . Not on file  Social History Narrative  . Not on file     BP 102/62   Pulse 89   Ht 5\' 11"  (1.803 m)   Wt 223 lb (101.2 kg)   SpO2 97%   BMI 31.10 kg/m   Physical Exam:  Well appearing NAD HEENT: Unremarkable Neck:  No JVD, no thyromegally Lymphatics:  No adenopathy Back:  No CVA tenderness Lungs:  Clear with no wheezes HEART:  Regular rate rhythm, no murmurs, no rubs, no clicks Abd:  soft, positive bowel sounds, no organomegally, no rebound, no guarding Ext:  2 plus pulses, no edema, no cyanosis, no clubbing Skin:  No rashes no nodules Neuro:  CN II through XII intact, motor grossly  intact   DEVICE  Normal device function.  See PaceArt for details.   Assess/Plan: 1. Atrial fib - his heart  is not well controlled but he is asymptomatic. I considered a number of options but have recommended watchful waiting for now. I do not think he is sick enough to recommend PPM upgrade/AV node RFA 2. Coags - he will continue warfarin 3. CAD - he denies anginal symptoms and is active walking with class 2 symptoms. 4. HTN - his blood pressure is well controlled.  Mikle Bosworth.D.

## 2018-03-15 NOTE — Patient Instructions (Signed)
Medication Instructions:  Your physician recommends that you continue on your current medications as directed. Please refer to the Current Medication list given to you today.  Labwork: None ordered.  Testing/Procedures: None ordered.  Follow-Up: Your physician wants you to follow-up in: one year with Dr. Lovena Le.   You will receive a reminder letter in the mail two months in advance. If you don't receive a letter, please call our office to schedule the follow-up appointment.  Remote monitoring is used to monitor your Pacemaker from home. This monitoring reduces the number of office visits required to check your device to one time per year. It allows [place] to keep an eye on the functioning of your device to ensure it is working properly. You are scheduled for a device check from home on 06/07/2018. You may send your transmission at any time that day. If you have a wireless device, the transmission will be sent automatically. After your physician reviews your transmission, you will receive a postcard with your next transmission date.  Any Other Special Instructions Will Be Listed Below (If Applicable).  If you need a refill on your cardiac medications before your next appointment, please call your pharmacy.

## 2018-04-19 ENCOUNTER — Other Ambulatory Visit: Payer: Self-pay | Admitting: Interventional Cardiology

## 2018-05-03 ENCOUNTER — Telehealth: Payer: Self-pay | Admitting: Interventional Cardiology

## 2018-05-03 NOTE — Telephone Encounter (Signed)
New Message   Kennyth Lose from Dr. Ladell Heads office wants to know if we do an INR check on patient and if so she wants the results from the last one performed.

## 2018-05-04 NOTE — Telephone Encounter (Signed)
I left message for [facility] with Dr. Delene Ruffini office informing per Pharm-D Fuller [place] we do not follow pt's INR. I do see that we did check it 08/2016, however has not been checked since.

## 2018-05-04 NOTE — Telephone Encounter (Signed)
We do not monitor patient's INR and have not checked it recently.

## 2018-05-04 NOTE — Telephone Encounter (Signed)
Spoke with pt and he states that Dr. Delene Ruffini office checks his INR.  He states he is going to call them and find out what they need exactly.

## 2018-05-12 LAB — CUP PACEART INCLINIC DEVICE CHECK
Battery Remaining Longevity: 58 mo
Battery Voltage: 2.76 V
Date Time Interrogation Session: 20200113145337
Implantable Lead Implant Date: 20090506
Implantable Lead Implant Date: 20090506
Implantable Lead Location: 753859
Implantable Lead Location: 753860
Implantable Lead Model: 4024
Implantable Pulse Generator Implant Date: 20090506
Lead Channel Impedance Value: 564 Ohm
Lead Channel Pacing Threshold Amplitude: 1 V
Lead Channel Pacing Threshold Pulse Width: 0.4 ms
Lead Channel Sensing Intrinsic Amplitude: 11.2 mV
Lead Channel Setting Pacing Amplitude: 2.5 V
Lead Channel Setting Pacing Pulse Width: 0.4 ms
Lead Channel Setting Sensing Sensitivity: 2.8 mV
MDC IDC MSMT BATTERY IMPEDANCE: 1473 Ohm
MDC IDC MSMT LEADCHNL RA IMPEDANCE VALUE: 67 Ohm
MDC IDC STAT BRADY RV PERCENT PACED: 9 %

## 2018-06-07 ENCOUNTER — Telehealth: Payer: Self-pay

## 2018-06-07 ENCOUNTER — Other Ambulatory Visit: Payer: Self-pay

## 2018-06-07 ENCOUNTER — Ambulatory Visit (INDEPENDENT_AMBULATORY_CARE_PROVIDER_SITE_OTHER): Payer: Medicare Other | Admitting: *Deleted

## 2018-06-07 DIAGNOSIS — I495 Sick sinus syndrome: Secondary | ICD-10-CM | POA: Diagnosis not present

## 2018-06-07 LAB — CUP PACEART REMOTE DEVICE CHECK
Battery Impedance: 1559 Ohm
Battery Remaining Longevity: 55 mo
Battery Voltage: 2.76 V
Brady Statistic RV Percent Paced: 13 %
Date Time Interrogation Session: 20200406165200
Implantable Lead Implant Date: 20090506
Implantable Lead Implant Date: 20090506
Implantable Lead Location: 753859
Implantable Lead Location: 753860
Implantable Lead Model: 4024
Implantable Pulse Generator Implant Date: 20090506
Lead Channel Impedance Value: 569 Ohm
Lead Channel Impedance Value: 67 Ohm
Lead Channel Pacing Threshold Amplitude: 0.875 V
Lead Channel Pacing Threshold Pulse Width: 0.4 ms
Lead Channel Setting Pacing Amplitude: 2.5 V
Lead Channel Setting Pacing Pulse Width: 0.4 ms
Lead Channel Setting Sensing Sensitivity: 4 mV

## 2018-06-07 NOTE — Telephone Encounter (Signed)
Left message for patient to remind of missed remote transmission.  

## 2018-06-15 ENCOUNTER — Encounter: Payer: Self-pay | Admitting: Cardiology

## 2018-06-15 NOTE — Progress Notes (Signed)
Remote pacemaker transmission.   

## 2018-07-08 ENCOUNTER — Ambulatory Visit: Payer: Medicare Other | Admitting: Interventional Cardiology

## 2018-07-10 ENCOUNTER — Other Ambulatory Visit: Payer: Self-pay | Admitting: Interventional Cardiology

## 2018-08-05 ENCOUNTER — Other Ambulatory Visit: Payer: Self-pay | Admitting: Interventional Cardiology

## 2018-09-06 ENCOUNTER — Ambulatory Visit (INDEPENDENT_AMBULATORY_CARE_PROVIDER_SITE_OTHER): Payer: Medicare Other | Admitting: *Deleted

## 2018-09-06 DIAGNOSIS — I495 Sick sinus syndrome: Secondary | ICD-10-CM | POA: Diagnosis not present

## 2018-09-07 LAB — CUP PACEART REMOTE DEVICE CHECK
Battery Impedance: 1616 Ohm
Battery Remaining Longevity: 54 mo
Battery Voltage: 2.75 V
Brady Statistic RV Percent Paced: 13 %
Date Time Interrogation Session: 20200707004026
Implantable Lead Implant Date: 20090506
Implantable Lead Implant Date: 20090506
Implantable Lead Location: 753859
Implantable Lead Location: 753860
Implantable Lead Model: 4024
Implantable Pulse Generator Implant Date: 20090506
Lead Channel Impedance Value: 567 Ohm
Lead Channel Impedance Value: 67 Ohm
Lead Channel Pacing Threshold Amplitude: 0.875 V
Lead Channel Pacing Threshold Pulse Width: 0.4 ms
Lead Channel Setting Pacing Amplitude: 2.5 V
Lead Channel Setting Pacing Pulse Width: 0.4 ms
Lead Channel Setting Sensing Sensitivity: 2.8 mV

## 2018-09-14 ENCOUNTER — Encounter: Payer: Self-pay | Admitting: Cardiology

## 2018-09-14 NOTE — Progress Notes (Signed)
Remote pacemaker transmission.   

## 2018-11-02 NOTE — Progress Notes (Signed)
Cardiology Office Note:    Date:  11/03/2018   ID:  Timothy Harper, DOB 02-14-1933, MRN [id number]  PCP:  Lavone Orn, MD  Cardiologist:  Sinclair Grooms, MD   Referring MD: Lavone Orn, MD   Chief Complaint  Patient presents with  . Atrial Fibrillation  . Congestive Heart Failure     History of Present Illness:    Timothy Harper is a 85 y.o. male with a hx of coronary artery disease with prior coronary bypass grafting and native vessel PCI, tachybradycardia syndrome with atrial fibrillation, hypertension, embolic CVA, and chronic anticoagulation therapy.  [name] continues to do well.  He walks 5 times per week.  He is having no chest pain or dyspnea.  He denies lower extremity swelling.  He has not had syncope and denies claudication.  No chills, fever, or other complaints.  Past Medical History:  Diagnosis Date  . 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 Atrial fibrillation ([place])   . BPH (benign prostatic hyperplasia)   . Cardiac pacemaker in situ   . Chronic atrial fibrillation   . Coronary artery disease involving coronary bypass graft 1994   LIMA to RCA . Prior PCI on RCA with restenosis  . CVA (cerebral infarction) 1995  . CVA (cerebral infarction)    Right  . Erectile dysfunction   . GERD (gastroesophageal reflux disease)   . Hearing loss    hearing aids  . Herpes zoster   . HTN (hypertension)   . Hyperlipemia   . Hypothyroidism   . Long term (current) use of anticoagulants   . Melanoma [facility])    right ear, Rosana Hoes 2002 and left arm 1997, [place]  . Primary male hypogonadism     Past Surgical History:  Procedure Laterality Date  . RIGHT/LEFT HEART CATH AND CORONARY/GRAFT ANGIOGRAPHY N/A 08/26/2016   08/26/16- Patent bypass grafts    Current Medications: Current Meds  Medication Sig  . acetaminophen (TYLENOL) 500 MG tablet Take 1,000 mg by mouth 2 (two) times daily as needed for moderate pain or headache.  Marland Kitchen aspirin 325 MG  tablet Take 325 mg by mouth daily as needed (leg cramps).   Marland Kitchen aspirin-sod bicarb-citric acid (ALKA-SELTZER) 325 MG TBEF tablet Take 325 mg by mouth daily as needed (acid reflux).  Marland Kitchen atorvastatin (LIPITOR) 20 MG tablet Take 20 mg by mouth at bedtime.   . carvedilol (COREG) 25 MG tablet TAKE 1 TABLET BY MOUTH TWICE DAILY.  Marland Kitchen Cholecalciferol (VITAMIN D3) 1000 units CAPS Take 1,000 Units by mouth daily.  . colchicine 0.6 MG tablet Take 0.6 mg by mouth daily as needed (gout pain). On hold as of 10/08/16  . DENTA 5000 PLUS 1.1 % CREA dental cream Apply 1 application topically at bedtime.   . digoxin (LANOXIN) 0.125 MG tablet Take 1 tablet (0.125 mg total) by mouth every Monday, Wednesday, and Friday.  Marland Kitchen ENTRESTO 24-26 MG TAKE 1 TABLET BY MOUTH TWICE DAILY.  . furosemide (LASIX) 20 MG tablet Take 1 tablet (20 mg total) by mouth daily. Please keep upcoming appt for future refills. Thank you  . levothyroxine (SYNTHROID, LEVOTHROID) 112 MCG tablet Take 112 mcg by mouth daily before breakfast.  . Magnesium 250 MG TABS Take 250 mg by mouth daily.  . potassium chloride (K-DUR,KLOR-CON) 10 MEQ tablet Take 10 mEq by mouth daily.  . sildenafil (VIAGRA) 100 MG tablet Take 100 mg by mouth daily as needed for erectile dysfunction.  Marland Kitchen  tamsulosin (FLOMAX) 0.4 MG CAPS capsule Take 0.4 mg by mouth every evening.   . Testosterone (FORTESTA) 10 MG/ACT (2%) GEL Place onto the skin. 4 pumps daily  . ULORIC 40 MG tablet Take 40 mg by mouth daily.  . vardenafil (LEVITRA) 20 MG tablet Take 20 mg by mouth daily as needed for erectile dysfunction.  Marland Kitchen warfarin (COUMADIN) 5 MG tablet Take 5 mg by mouth daily. Take 5mg s by mouth daily except fridays dont take any warfarin     Allergies:   Penicillins and Prednisone   Social History   Socioeconomic History  . Marital status: Married    Spouse name: Not on file  . Number of children: 4  . Years of education: Not on file  . Highest education level: Not on file  Occupational  History  . 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 Financial resource strain: Not on file  . Food insecurity    Worry: Not on file    Inability: Not on file  . Transportation needs    Medical: Not on file    Non-medical: Not on file  Tobacco Use  . Smoking status: Former Smoker    Quit date: 08/01/1988    Years since quitting: 30.2  . Smokeless tobacco: Never Used  Substance and Sexual Activity  . Alcohol use: Yes    Comment: 2-3 times a week  . Drug use: No  . Sexual activity: Not on file  Lifestyle  . Physical activity    Days per week: Not on file    Minutes per session: Not on file  . Stress: Not on file  Relationships  . Social Herbalist on phone: Not on file    Gets together: Not on file    Attends religious service: Not on file    Active member of club or organization: Not on file    Attends meetings of clubs or organizations: Not on file    Relationship status: Not on file  Other Topics Concern  . Not on file  Social History Narrative  . Not on file     Family History: The patient's family history includes Heart disease in his mother.  ROS:   Please see the history of present illness.    No complaints.  Conscientious about exercise.  No depression.  All other systems reviewed and are negative.  EKGs/Labs/Other Studies Reviewed:    The following studies were reviewed today: No new data.  EKG:  EKG atrial fibrillation with moderate rate control and resting heart rate 100 bpm.  Ashman's phenomenon is noted.  No acute ST-T wave changes noted.  Nonspecific ST depression is seen.  When compared to the prior tracing from September 2019, the heart rate is faster.  Recent Labs: 03/09/2018: BUN 19; Creatinine, Ser 1.34; Hemoglobin 15.2; Platelets 237; Potassium 4.7; Sodium 141  Recent Lipid Panel No results found for: CHOL, TRIG, HDL, CHOLHDL, VLDL, LDLCALC, LDLDIRECT  Physical Exam:    VS:  BP 118/64   Pulse 100   Ht 5'  11" (1.803 m)   Wt 218 lb (98.9 kg)   SpO2 96%   BMI 30.40 kg/m     Wt Readings from Last 3 Encounters:  11/03/18 218 lb (98.9 kg)  03/15/18 223 lb (101.2 kg)  03/09/18 221 lb 12.8 oz (100.6 kg)     GEN: Elderly and has been able to keep his weight under control.. No acute distress HEENT: Normal  NECK: No JVD. LYMPHATICS: No lymphadenopathy CARDIAC: Irregularly irregular RR without murmur, gallop, or edema. VASCULAR:  Normal Pulses. No bruits. RESPIRATORY:  Clear to auscultation without rales, wheezing or rhonchi  ABDOMEN: Soft, non-tender, non-distended, No pulsatile mass, MUSCULOSKELETAL: No deformity  SKIN: Warm and dry NEUROLOGIC:  Alert and oriented x 3 PSYCHIATRIC:  Normal affect   ASSESSMENT:    1. Coronary artery disease involving coronary bypass graft of native heart with angina pectoris ([place])   2. Chronic combined systolic and diastolic heart failure ([place])   3. Chronic atrial fibrillation   4. Cardiac pacemaker in situ   5. Essential hypertension   6. Encounter for therapeutic drug monitoring   7. Educated About Covid-19 Virus Infection    PLAN:    In order of problems listed above:  1. Secondary prevention discussed in detail. 2. No significant volume overload based upon clinical exam. 3. Heart rate is slightly higher today.  Not yet had a.m. carvedilol. 4. No evidence of pacemaker dysfunction.  One pacemaker spike is noted and it was appropriate on the today's EKG. 5. Target blood pressure is 130/80 mmHg or less 6. He is on anticoagulation therapy without complications. 7. Educated concerning COVID-19 prevention including washing, wearing, and distancing.  Clinical follow-up in 9 to 12 months.  Earlier if cardiac symptoms.   Medication Adjustments/Labs and Tests Ordered: Current medicines are reviewed at length with the patient today.  Concerns regarding medicines are outlined above.  No orders of the defined types were placed in this encounter.  No  orders of the defined types were placed in this encounter.   There are no Patient Instructions on file for this visit.   Signed, Sinclair Grooms, MD  11/03/2018 9:44 AM    [place]

## 2018-11-03 ENCOUNTER — Other Ambulatory Visit: Payer: Self-pay | Admitting: Interventional Cardiology

## 2018-11-03 ENCOUNTER — Other Ambulatory Visit: Payer: Self-pay

## 2018-11-03 ENCOUNTER — Ambulatory Visit (INDEPENDENT_AMBULATORY_CARE_PROVIDER_SITE_OTHER): Payer: Medicare Other | Admitting: Interventional Cardiology

## 2018-11-03 ENCOUNTER — Encounter: Payer: Self-pay | Admitting: Interventional Cardiology

## 2018-11-03 VITALS — BP 118/64 | HR 100 | Ht 71.0 in | Wt 218.0 lb

## 2018-11-03 DIAGNOSIS — I5042 Chronic combined systolic (congestive) and diastolic (congestive) heart failure: Secondary | ICD-10-CM | POA: Diagnosis not present

## 2018-11-03 DIAGNOSIS — I25709 Atherosclerosis of coronary artery bypass graft(s), unspecified, with unspecified angina pectoris: Secondary | ICD-10-CM | POA: Diagnosis not present

## 2018-11-03 DIAGNOSIS — Z7189 Other specified counseling: Secondary | ICD-10-CM

## 2018-11-03 DIAGNOSIS — I482 Chronic atrial fibrillation, unspecified: Secondary | ICD-10-CM | POA: Diagnosis not present

## 2018-11-03 DIAGNOSIS — Z950 Presence of cardiac pacemaker: Secondary | ICD-10-CM | POA: Diagnosis not present

## 2018-11-03 DIAGNOSIS — I10 Essential (primary) hypertension: Secondary | ICD-10-CM

## 2018-11-03 DIAGNOSIS — Z5181 Encounter for therapeutic drug level monitoring: Secondary | ICD-10-CM

## 2018-11-03 NOTE — Patient Instructions (Signed)
Medication Instructions:  Your physician recommends that you continue on your current medications as directed. Please refer to the Current Medication list given to you today.  If you need a refill on your cardiac medications before your next appointment, please call your pharmacy.   Lab work: None If you have labs (blood work) drawn today and your tests are completely normal, you will receive your results only by: . MyChart Message (if you have MyChart) OR . A paper copy in the mail If you have any lab test that is abnormal or we need to change your treatment, we will call you to review the results.  Testing/Procedures: None  Follow-Up: At [facility], you and your health needs are our priority.  As part of our continuing mission to provide you with exceptional heart care, we have created designated Provider Care Teams.  These Care Teams include your primary Cardiologist (physician) and Advanced Practice Providers (APPs -  Physician Assistants and Nurse Practitioners) who all work together to provide you with the care you need, when you need it. You will need a follow up appointment in 9-12 months.  Please call our office 2 months in advance to schedule this appointment.  You may see Henry W Smith III, MD or one of the following Advanced Practice Providers on your designated Care Team:   Lori Gerhardt, NP Laura Ingold, NP . Jill McDaniel, NP  Any Other Special Instructions Will Be Listed Below (If Applicable).    

## 2018-12-06 ENCOUNTER — Ambulatory Visit (INDEPENDENT_AMBULATORY_CARE_PROVIDER_SITE_OTHER): Payer: Medicare Other | Admitting: *Deleted

## 2018-12-06 DIAGNOSIS — I639 Cerebral infarction, unspecified: Secondary | ICD-10-CM

## 2018-12-06 DIAGNOSIS — I4891 Unspecified atrial fibrillation: Secondary | ICD-10-CM

## 2018-12-07 ENCOUNTER — Telehealth: Payer: Self-pay

## 2018-12-07 NOTE — Telephone Encounter (Signed)
Pt needed help sending a manual transmission with his home monitor. I gave him the number to [place] for additional help.

## 2018-12-08 LAB — CUP PACEART REMOTE DEVICE CHECK
Battery Impedance: 1732 Ohm
Battery Remaining Longevity: 51 mo
Battery Voltage: 2.75 V
Brady Statistic RV Percent Paced: 13 %
Date Time Interrogation Session: 20201006210942
Implantable Lead Implant Date: 20090506
Implantable Lead Implant Date: 20090506
Implantable Lead Location: 753859
Implantable Lead Location: 753860
Implantable Lead Model: 4024
Implantable Pulse Generator Implant Date: 20090506
Lead Channel Impedance Value: 586 Ohm
Lead Channel Impedance Value: 67 Ohm
Lead Channel Pacing Threshold Amplitude: 0.875 V
Lead Channel Pacing Threshold Pulse Width: 0.4 ms
Lead Channel Setting Pacing Amplitude: 2.5 V
Lead Channel Setting Pacing Pulse Width: 0.4 ms
Lead Channel Setting Sensing Sensitivity: 2.8 mV

## 2018-12-13 NOTE — Progress Notes (Signed)
Remote pacemaker transmission.   

## 2019-01-10 ENCOUNTER — Other Ambulatory Visit: Payer: Self-pay | Admitting: Interventional Cardiology

## 2019-01-10 DIAGNOSIS — I5042 Chronic combined systolic (congestive) and diastolic (congestive) heart failure: Secondary | ICD-10-CM

## 2019-02-10 ENCOUNTER — Other Ambulatory Visit: Payer: Self-pay | Admitting: Interventional Cardiology

## 2019-03-07 ENCOUNTER — Ambulatory Visit (INDEPENDENT_AMBULATORY_CARE_PROVIDER_SITE_OTHER): Payer: Medicare Other | Admitting: *Deleted

## 2019-03-07 DIAGNOSIS — I639 Cerebral infarction, unspecified: Secondary | ICD-10-CM

## 2019-03-07 LAB — CUP PACEART REMOTE DEVICE CHECK
Battery Impedance: 1792 Ohm
Battery Remaining Longevity: 49 mo
Battery Voltage: 2.75 V
Brady Statistic RV Percent Paced: 12 %
Date Time Interrogation Session: 20210104101005
Implantable Lead Implant Date: 20090506
Implantable Lead Implant Date: 20090506
Implantable Lead Location: 753859
Implantable Lead Location: 753860
Implantable Lead Model: 4024
Implantable Pulse Generator Implant Date: 20090506
Lead Channel Impedance Value: 551 Ohm
Lead Channel Impedance Value: 67 Ohm
Lead Channel Pacing Threshold Amplitude: 0.875 V
Lead Channel Pacing Threshold Pulse Width: 0.4 ms
Lead Channel Setting Pacing Amplitude: 2.5 V
Lead Channel Setting Pacing Pulse Width: 0.4 ms
Lead Channel Setting Sensing Sensitivity: 4 mV

## 2019-06-06 ENCOUNTER — Ambulatory Visit (INDEPENDENT_AMBULATORY_CARE_PROVIDER_SITE_OTHER): Payer: Medicare Other | Admitting: *Deleted

## 2019-06-06 DIAGNOSIS — I639 Cerebral infarction, unspecified: Secondary | ICD-10-CM

## 2019-06-06 LAB — CUP PACEART REMOTE DEVICE CHECK
Battery Impedance: 1850 Ohm
Battery Remaining Longevity: 48 mo
Battery Voltage: 2.75 V
Brady Statistic RV Percent Paced: 12 %
Date Time Interrogation Session: 20210405085036
Implantable Lead Implant Date: 20090506
Implantable Lead Implant Date: 20090506
Implantable Lead Location: 753859
Implantable Lead Location: 753860
Implantable Lead Model: 4024
Implantable Pulse Generator Implant Date: 20090506
Lead Channel Impedance Value: 559 Ohm
Lead Channel Impedance Value: 67 Ohm
Lead Channel Pacing Threshold Amplitude: 0.875 V
Lead Channel Pacing Threshold Pulse Width: 0.4 ms
Lead Channel Setting Pacing Amplitude: 2.5 V
Lead Channel Setting Pacing Pulse Width: 0.4 ms
Lead Channel Setting Sensing Sensitivity: 2.8 mV

## 2019-06-07 NOTE — Progress Notes (Signed)
PPM Remote  

## 2019-08-09 ENCOUNTER — Telehealth: Payer: Self-pay | Admitting: Interventional Cardiology

## 2019-08-09 NOTE — Telephone Encounter (Signed)
Pt has been having a hard time sleeping. He would like someone from the office to check him out but would like to be seen sooner than the appointment he has been scheduled for 08/31/19

## 2019-08-09 NOTE — Telephone Encounter (Signed)
Spoke with pt and he states he has not been able to sleep well at night and this causes him to be sleepy during the day.  Asked if he has been having issues with breathing, especially when lying down.  Pt denies any SOB.  Advised pt to reach out to PCP.  Also advised he could send in a device recording to make sure there isn't a rhythm issue.  Pt verbalized understanding and was in agreement with this plan.

## 2019-08-23 NOTE — Progress Notes (Signed)
CARDIOLOGY OFFICE NOTE  Date:  08/31/2019    Timothy Harper Date of Birth: Mar 16, 1932 Medical Record #[id number]  PCP:  Lavone Orn, MD  Cardiologist:  Jennings Books   Chief Complaint  Patient presents with  . Follow-up    History of Present Illness: Timothy Harper is a 86 y.o. male who presents today for a 9 month/work in visit. Seen for Dr. Tamala Julian.   He has a history of known CAD with prior CABG and native vessel PCI, tachybradycardia syndrome with atrial fibrillation, chronic systolic HF with EF of 48%, hypertension, embolic CVA, and chronic anticoagulation therapy.  He was last seen by Dr. Tamala Julian back last September - felt to be doing ok. Was walking 5 times a week.   Phone call earlier this month - wanted earlier appointment due to issues with not sleeping. Thus added to my schedule for today.   Comes in today. Here alone. He has been vaccinated. Not really clear to me as to why he is here today. He notes he had been sleeping during the day - not at night - now this is better. No chest pain. Will get a little winded with walking up inclines - but he was able to do this yesterday with no issue. Walking 3 to 4 times a week as a general rule. He has had one bad fall - in the street - about 2 months ago - did not seek medical attention. No serious injury.  Remains on Coumadin. Admits to me that sometimes he misses his medicines and "then makes up for it" - sounds like he doubles up - he was cautioned against doing that.   Past Medical History:  Diagnosis Date  . 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 Atrial fibrillation ([place])   . BPH (benign prostatic hyperplasia)   . Cardiac pacemaker in situ   . Chronic atrial fibrillation ([place])   . Coronary artery disease involving coronary bypass graft 1994   LIMA to RCA . Prior PCI on RCA with restenosis  . CVA (cerebral infarction) 1995  . CVA (cerebral infarction)    Right  . Erectile dysfunction   .  GERD (gastroesophageal reflux disease)   . Hearing loss    hearing aids  . Herpes zoster   . HTN (hypertension)   . Hyperlipemia   . Hypothyroidism   . Long term (current) use of anticoagulants   . Melanoma [facility])    right ear, Rosana Hoes 2002 and left arm 1997, [place]  . Primary male hypogonadism     Past Surgical History:  Procedure Laterality Date  . RIGHT/LEFT HEART CATH AND CORONARY/GRAFT ANGIOGRAPHY N/A 08/26/2016   08/26/16- Patent bypass grafts     Medications: Current Meds  Medication Sig  . acetaminophen (TYLENOL) 500 MG tablet Take 1,000 mg by mouth 2 (two) times daily as needed for moderate pain or headache.  Marland Kitchen aspirin 325 MG tablet Take 325 mg by mouth daily as needed (leg cramps).   Marland Kitchen aspirin-sod bicarb-citric acid (ALKA-SELTZER) 325 MG TBEF tablet Take 325 mg by mouth daily as needed (acid reflux).  Marland Kitchen atorvastatin (LIPITOR) 20 MG tablet Take 20 mg by mouth at bedtime.   . carvedilol (COREG) 25 MG tablet TAKE 1 TABLET BY MOUTH TWICE DAILY.  Marland Kitchen Cholecalciferol (VITAMIN D3) 1000 units CAPS Take 1,000 Units by mouth daily.  . colchicine 0.6 MG tablet Take 0.6 mg by mouth daily as needed (gout pain).  On hold as of 10/08/16  . DENTA 5000 PLUS 1.1 % CREA dental cream Apply 1 application topically at bedtime.   . digoxin (LANOXIN) 0.125 MG tablet TAKE (1) TABLET ON MONDAY, WEDNESDAY AND FRIDAY.  Marland Kitchen ENTRESTO 24-26 MG TAKE 1 TABLET BY MOUTH TWICE DAILY.  . furosemide (LASIX) 20 MG tablet TAKE 1 TABLET ONCE DAILY.  Marland Kitchen levothyroxine (SYNTHROID, LEVOTHROID) 112 MCG tablet Take 112 mcg by mouth daily before breakfast.  . Magnesium 250 MG TABS Take 250 mg by mouth daily.  . potassium chloride (K-DUR) 10 MEQ tablet TAKE 1 TABLET EACH DAY.  . tamsulosin (FLOMAX) 0.4 MG CAPS capsule Take 0.4 mg by mouth every evening.   . Testosterone (FORTESTA) 10 MG/ACT (2%) GEL Place onto the skin. 4 pumps daily  . vardenafil (LEVITRA) 20 MG tablet Take 20 mg by mouth daily as needed for erectile  dysfunction.  Marland Kitchen warfarin (COUMADIN) 5 MG tablet Take 5 mg by mouth daily. Take 5mg s by mouth daily except fridays dont take any warfarin     Allergies: Allergies  Allergen Reactions  . Penicillins     Swollen face/lips Has patient had a PCN reaction causing immediate rash, facial/tongue/throat swelling, SOB or lightheadedness with hypotension: Yes Has patient had a PCN reaction causing severe rash involving mucus membranes or skin necrosis: No Has patient had a PCN reaction that required hospitalization: No Has patient had a PCN reaction occurring within the last 10 years: No If all of the above answers are "NO", then may proceed with Cephalosporin use.   . Prednisone     swelling    Social History: The patient  reports that he quit smoking about 31 years ago. He has never used smokeless tobacco. He reports current alcohol use. He reports that he does not use drugs.   Family History: The patient's family history includes Heart disease in his mother.   Review of Systems: Please see the history of present illness.   All other systems are reviewed and negative.   Physical Exam: VS:  BP 124/76   Pulse 90   Ht 5\' 11"  (1.803 m)   Wt 223 lb 12.8 oz (101.5 kg)   SpO2 97%   BMI 31.21 kg/m  .  BMI Body mass index is 31.21 kg/m.  Wt Readings from Last 3 Encounters:  08/31/19 223 lb 12.8 oz (101.5 kg)  11/03/18 218 lb (98.9 kg)  03/15/18 223 lb (101.2 kg)    General: Alert and in no acute distress.  Cardiac: Irregular irregular rhythm. Rate is fine. No edema.  Respiratory:  Lungs are clear to auscultation bilaterally with normal work of breathing.  GI: Soft and nontender.  MS: No deformity or atrophy. Gait and ROM intact.  Skin: Warm and dry. Color is normal.  Neuro:  Strength and sensation are intact and no gross focal deficits noted.  Psych: Alert, appropriate and with normal affect.   LABORATORY DATA:  EKG:  EKG is not ordered today.   Lab Results  Component Value  Date   WBC 7.0 03/09/2018   HGB 15.2 03/09/2018   HCT 44.3 03/09/2018   PLT 237 03/09/2018   GLUCOSE 88 03/09/2018   ALT 61 (H) 08/20/2016   AST 39 08/20/2016   NA 141 03/09/2018   K 4.7 03/09/2018   CL 101 03/09/2018   CREATININE 1.34 (H) 03/09/2018   BUN 19 03/09/2018   CO2 24 03/09/2018   TSH 2.800 07/09/2016   INR 2.0 09/05/2016  BNP (last 3 results) No results for input(s): BNP in the last 8760 hours.  ProBNP (last 3 results) No results for input(s): PROBNP in the last 8760 hours.   Other Studies Reviewed Today:  Echo Study Conclusions 03/2017  - Left ventricle: The cavity size was normal. There was mild  concentric hypertrophy. Systolic function was moderately to  severely reduced. The estimated ejection fraction was in the  range of 30% to 35%. Diffuse hypokinesis.  - Ventricular septum: Septal motion showed paradox.  - Left atrium: The atrium was severely dilated.  - Right ventricle: The cavity size was moderately dilated. Wall  thickness was normal. Systolic function was moderately reduced.  - Right atrium: The atrium was severely dilated. Pacer wire or  catheter noted in right atrium.  - Tricuspid valve: There was mild regurgitation.  - Inferior vena cava: The vessel was dilated. The respirophasic  diameter changes were blunted (< 50%), consistent with elevated  central venous pressure.  - Pericardium, extracardiac: There was no pericardial effusion.   Impressions:   - There is no significant difference from the prior study on  07/23/2016, LVEF remains severely depressed at 30-35% with diffuse  hypokinesis and paradoxical septal motion.  RVEF is moderately reduced. There is severe bi-atrial dilatation.    ASSESSMENT & PLAN:    1. Complaint of sleep disturbance - this seems better - would defer to PCP.   2. CAD with prior CABG/PCI - he has no worrisome symptoms noted. Would favor conservative management.   3. HTN - BP  looks great on his current regimen.   4. Persistent AF - managed with rate control and remains on Coumadin.   5. Underlying PPM - per EP.   6. Chronic anticoagulation - cautioned against "doubling up". INR is checked thru his PCP.  7. Chronic combined systolic and diastolic HF - seems stable with his current regimen.   Current medicines are reviewed with the patient today.  The patient does not have concerns regarding medicines other than what has been noted above.  The following changes have been made:  See above.  Labs/ tests ordered today include:   No orders of the defined types were placed in this encounter.    Disposition:   FU with Dr. Tamala Julian later this fall. I think he has some memory issues going on. I worry about his medication compliance. He tells me he is seeing Dr. Laurann Montana soon.   Patient is agreeable to this plan and will call if any problems develop in the interim.   SignedTruitt Merle, NP  08/31/2019 10:59 AM  [facility] [address] [place], [place]  [address] Phone: [phone] Fax: [phone]

## 2019-08-31 ENCOUNTER — Encounter: Payer: Self-pay | Admitting: Nurse Practitioner

## 2019-08-31 ENCOUNTER — Other Ambulatory Visit: Payer: Self-pay

## 2019-08-31 ENCOUNTER — Ambulatory Visit: Payer: Medicare Other | Admitting: Nurse Practitioner

## 2019-08-31 VITALS — BP 124/76 | HR 90 | Ht 71.0 in | Wt 223.8 lb

## 2019-08-31 DIAGNOSIS — I10 Essential (primary) hypertension: Secondary | ICD-10-CM

## 2019-08-31 DIAGNOSIS — I4819 Other persistent atrial fibrillation: Secondary | ICD-10-CM | POA: Diagnosis not present

## 2019-08-31 DIAGNOSIS — I5042 Chronic combined systolic (congestive) and diastolic (congestive) heart failure: Secondary | ICD-10-CM

## 2019-08-31 DIAGNOSIS — I2510 Atherosclerotic heart disease of native coronary artery without angina pectoris: Secondary | ICD-10-CM

## 2019-08-31 DIAGNOSIS — G479 Sleep disorder, unspecified: Secondary | ICD-10-CM

## 2019-08-31 NOTE — Patient Instructions (Addendum)
After Visit Summary:  We will be checking the following labs today - NONE   Medication Instructions:    Continue with your current medicines.    If you need a refill on your cardiac medications before your next appointment, please call your pharmacy.     Testing/Procedures To Be Arranged:  N/A  Follow-Up:   See Dr.Smith sometime this fall - September/October.    At [facility], you and your health needs are our priority.  As part of our continuing mission to provide you with exceptional heart care, we have created designated Provider Care Teams.  These Care Teams include your primary Cardiologist (physician) and Advanced Practice Providers (APPs -  Physician Assistants and Nurse Practitioners) who all work together to provide you with the care you need, when you need it.  Special Instructions:  . Stay safe, wash your hands for at least 20 seconds and wear a mask when needed.  . It was good to talk with you today.    Call the [place] office at [phone] if you have any questions, problems or concerns.

## 2019-09-28 ENCOUNTER — Ambulatory Visit (INDEPENDENT_AMBULATORY_CARE_PROVIDER_SITE_OTHER): Payer: Medicare Other | Admitting: *Deleted

## 2019-09-28 DIAGNOSIS — I4891 Unspecified atrial fibrillation: Secondary | ICD-10-CM | POA: Diagnosis not present

## 2019-09-28 DIAGNOSIS — I495 Sick sinus syndrome: Secondary | ICD-10-CM

## 2019-09-28 LAB — CUP PACEART REMOTE DEVICE CHECK
Battery Impedance: 2062 Ohm
Battery Remaining Longevity: 43 mo
Battery Voltage: 2.74 V
Brady Statistic RV Percent Paced: 12 %
Date Time Interrogation Session: 20210728130530
Implantable Lead Implant Date: 20090506
Implantable Lead Implant Date: 20090506
Implantable Lead Location: 753859
Implantable Lead Location: 753860
Implantable Lead Model: 4024
Implantable Pulse Generator Implant Date: 20090506
Lead Channel Impedance Value: 596 Ohm
Lead Channel Impedance Value: 67 Ohm
Lead Channel Pacing Threshold Amplitude: 0.875 V
Lead Channel Pacing Threshold Pulse Width: 0.4 ms
Lead Channel Setting Pacing Amplitude: 2.5 V
Lead Channel Setting Pacing Pulse Width: 0.4 ms
Lead Channel Setting Sensing Sensitivity: 2.8 mV

## 2019-09-30 NOTE — Progress Notes (Signed)
Remote pacemaker transmission.   

## 2019-10-11 ENCOUNTER — Other Ambulatory Visit: Payer: Self-pay | Admitting: Interventional Cardiology

## 2019-10-11 DIAGNOSIS — I5042 Chronic combined systolic (congestive) and diastolic (congestive) heart failure: Secondary | ICD-10-CM

## 2019-11-14 ENCOUNTER — Other Ambulatory Visit: Payer: Self-pay | Admitting: Interventional Cardiology

## 2019-12-12 ENCOUNTER — Other Ambulatory Visit: Payer: Self-pay | Admitting: Nurse Practitioner

## 2019-12-12 ENCOUNTER — Other Ambulatory Visit: Payer: Self-pay | Admitting: Interventional Cardiology

## 2020-01-23 LAB — CUP PACEART REMOTE DEVICE CHECK
Battery Impedance: 2155 Ohm
Battery Remaining Longevity: 41 mo
Battery Voltage: 2.74 V
Brady Statistic RV Percent Paced: 11 %
Date Time Interrogation Session: 20211122130214
Implantable Lead Implant Date: 20090506
Implantable Lead Implant Date: 20090506
Implantable Lead Location: 753859
Implantable Lead Location: 753860
Implantable Lead Model: 4024
Implantable Pulse Generator Implant Date: 20090506
Lead Channel Impedance Value: 578 Ohm
Lead Channel Impedance Value: 67 Ohm
Lead Channel Pacing Threshold Amplitude: 0.625 V
Lead Channel Pacing Threshold Pulse Width: 0.4 ms
Lead Channel Setting Pacing Amplitude: 2.5 V
Lead Channel Setting Pacing Pulse Width: 0.4 ms
Lead Channel Setting Sensing Sensitivity: 4 mV

## 2020-01-24 ENCOUNTER — Ambulatory Visit (INDEPENDENT_AMBULATORY_CARE_PROVIDER_SITE_OTHER): Payer: Medicare Other

## 2020-01-24 DIAGNOSIS — I495 Sick sinus syndrome: Secondary | ICD-10-CM

## 2020-01-24 NOTE — Progress Notes (Signed)
Cardiology Office Note:    Date:  01/25/2020   ID:  Timothy Harper, DOB 1933/02/04, MRN [id number]  PCP:  Lavone Orn, MD  Cardiologist:  Sinclair Grooms, MD   Referring MD: Lavone Orn, MD   Chief Complaint  Patient presents with  . Coronary Artery Disease  . Congestive Heart Failure  . Atrial Fibrillation    History of Present Illness:    PHU RECORD is a 86 y.o. male with a hx of coronary artery disease with prior coronary bypass grafting and native vessel PCI, tachybradycardia syndrome with atrial fibrillation, hypertension, embolic CVA, and chronic anticoagulation therapy.  Hal has dyspnea on exertion.  He has a hill that he has to climb to get on flat ground.  This causes him to have to stop and rest.  Once on a flat plane he is able to walk quite well.  He is also sleeping on his sofa.  He has done this for quite some time.  This is mostly because he is able to prop up more easily.  He cannot sleep in bed.  He has not noticed swelling.  He denies angina, neurological symptoms, edema, and syncope.  Past Medical History:  Diagnosis Date  . 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 Atrial fibrillation ([place])   . BPH (benign prostatic hyperplasia)   . Cardiac pacemaker in situ   . Chronic atrial fibrillation ([place])   . Coronary artery disease involving coronary bypass graft 1994   LIMA to RCA . Prior PCI on RCA with restenosis  . CVA (cerebral infarction) 1995  . CVA (cerebral infarction)    Right  . Erectile dysfunction   . GERD (gastroesophageal reflux disease)   . Hearing loss    hearing aids  . Herpes zoster   . HTN (hypertension)   . Hyperlipemia   . Hypothyroidism   . Long term (current) use of anticoagulants   . Melanoma [facility])    right ear, Rosana Hoes 2002 and left arm 1997, [place]  . Primary male hypogonadism     Past Surgical History:  Procedure Laterality Date  . RIGHT/LEFT HEART CATH AND CORONARY/GRAFT ANGIOGRAPHY  N/A 08/26/2016   08/26/16- Patent bypass grafts    Current Medications: Current Meds  Medication Sig  . acetaminophen (TYLENOL) 500 MG tablet Take 1,000 mg by mouth 2 (two) times daily as needed for moderate pain or headache.  Marland Kitchen aspirin 325 MG tablet Take 325 mg by mouth daily as needed (leg cramps).   Marland Kitchen aspirin-sod bicarb-citric acid (ALKA-SELTZER) 325 MG TBEF tablet Take 325 mg by mouth daily as needed (acid reflux).  Marland Kitchen atorvastatin (LIPITOR) 20 MG tablet Take 20 mg by mouth at bedtime.   . carvedilol (COREG) 25 MG tablet TAKE 1 TABLET BY MOUTH TWICE DAILY.  Marland Kitchen Cholecalciferol (VITAMIN D3) 1000 units CAPS Take 1,000 Units by mouth daily.  . colchicine 0.6 MG tablet Take 0.6 mg by mouth daily as needed (gout pain). On hold as of 10/08/16  . DENTA 5000 PLUS 1.1 % CREA dental cream Apply 1 application topically at bedtime.   . digoxin (LANOXIN) 0.125 MG tablet TAKE (1) TABLET ON MONDAY, WEDNESDAY AND FRIDAY.  Marland Kitchen ENTRESTO 24-26 MG TAKE 1 TABLET BY MOUTH TWICE DAILY.  . furosemide (LASIX) 20 MG tablet TAKE 1 TABLET ONCE DAILY.  Marland Kitchen levothyroxine (SYNTHROID, LEVOTHROID) 112 MCG tablet Take 112 mcg by mouth daily before breakfast.  . Magnesium 250 MG TABS Take 250  mg by mouth daily.  . potassium chloride (KLOR-CON) 10 MEQ tablet TAKE 1 TABLET EACH DAY.  . tamsulosin (FLOMAX) 0.4 MG CAPS capsule Take 0.4 mg by mouth every evening.   . Testosterone (FORTESTA) 10 MG/ACT (2%) GEL Place onto the skin. 4 pumps daily  . vardenafil (LEVITRA) 20 MG tablet Take 20 mg by mouth daily as needed for erectile dysfunction.  Marland Kitchen warfarin (COUMADIN) 5 MG tablet Take 5 mg by mouth daily. Take 5mg s by mouth daily except fridays dont take any warfarin     Allergies:   Penicillins and Prednisone   Social History   Socioeconomic History  . Marital status: Married    Spouse name: Not on file  . Number of children: 4  . Years of education: Not on file  . Highest education level: Not on file  Occupational History  .  Occupation: Music therapist    Comment: retired  Tobacco Use  . Smoking status: Former Smoker    Quit date: 08/01/1988    Years since quitting: 31.5  . Smokeless tobacco: Never Used  Vaping Use  . Vaping Use: Never used  Substance and Sexual Activity  . Alcohol use: Yes    Comment: 2-3 times a week  . Drug use: No  . Sexual activity: Not on file  Other Topics Concern  . Not on file  Social History Narrative  . Not on file   Social Determinants of Health   Financial Resource Strain:   . Difficulty of Paying Living Expenses: Not on file  Food Insecurity:   . Worried About Charity fundraiser in the Last Year: Not on file  . Ran Out of Food in the Last Year: Not on file  Transportation Needs:   . Lack of Transportation (Medical): Not on file  . Lack of Transportation (Non-Medical): Not on file  Physical Activity:   . Days of Exercise per Week: Not on file  . Minutes of Exercise per Session: Not on file  Stress:   . Feeling of Stress : Not on file  Social Connections:   . Frequency of Communication with Friends and Family: Not on file  . Frequency of Social Gatherings with Friends and Family: Not on file  . Attends Religious Services: Not on file  . Active Member of Clubs or Organizations: Not on file  . Attends Archivist Meetings: Not on file  . Marital Status: Not on file     Family History: The patient's family history includes Heart disease in his mother.  ROS:   Please see the history of present illness.    Has difficulty with balance.  This is impacting his ability to walk distances.  He had one fall when not walking at night.  Did not hit his head.  He still walks 3-4 times per week.  All other systems reviewed and are negative.  EKGs/Labs/Other Studies Reviewed:    The following studies were reviewed today: Echocardiogram last performed in 2019 demonstrating EF 30 to 35%.  EKG:  EKG demonstrates atrial fibrillation, ventricular rate 95 bpm,  diffuse precordial ST abnormality, and when compared to 11/03/2018, the heart rate is faster but otherwise no significant change.  Recent Labs: No results found for requested labs within last 8760 hours.  Recent Lipid Panel No results found for: CHOL, TRIG, HDL, CHOLHDL, VLDL, LDLCALC, LDLDIRECT  Physical Exam:    VS:  BP 115/78   Pulse 95   Temp (!) 93.4 F (34.1 C)  Ht 5\' 10"  (1.778 m)   Wt 222 lb 3.2 oz (100.8 kg)   SpO2 95%   BMI 31.88 kg/m     Wt Readings from Last 3 Encounters:  01/25/20 222 lb 3.2 oz (100.8 kg)  08/31/19 223 lb 12.8 oz (101.5 kg)  11/03/18 218 lb (98.9 kg)     GEN: Obese, elderly and more frail than I have seen him.. No acute distress HEENT: Normal NECK: No JVD. LYMPHATICS: No lymphadenopathy CARDIAC: Irregularly irregular RR without murmur, gallop, or edema. VASCULAR:  Normal Pulses. No bruits. RESPIRATORY:  Clear to auscultation without rales, wheezing or rhonchi  ABDOMEN: Soft, non-tender, non-distended, No pulsatile mass, MUSCULOSKELETAL: No deformity  SKIN: Warm and dry NEUROLOGIC:  Alert and oriented x 3 PSYCHIATRIC:  Normal affect   ASSESSMENT:    1. Persistent atrial fibrillation ([place])   2. Chronic combined systolic and diastolic heart failure ([place])   3. Essential hypertension   4. Coronary artery disease involving coronary bypass graft of native heart with angina pectoris ([place])   5. Cardiac pacemaker in situ   6. Educated about COVID-19 virus infection   7. SOB (shortness of breath)   8. High risk medications (not anticoagulants) long-term use    PLAN:    In order of problems listed above:  1. Weight control is not as good as normal.  He is taking beta-blocker therapy and Lanoxin.  We will check a dig level today.  He takes Lanoxin Monday, Wednesday, and Friday.  We may need to increase to every day of the week. 2. 2D Doppler echocardiogram will be performed along with a basic metabolic panel and Bmet.  I suspect he needs more  diuretic therapy.  He is on furosemide 20 mg/day. 3. No angina.  Secondary risk prevention is discussed. 4. Followed in pacemaker clinic. 5. Vaccinated, boosted, and practicing social mitigation.   Medication Adjustments/Labs and Tests Ordered: Current medicines are reviewed at length with the patient today.  Concerns regarding medicines are outlined above.  Orders Placed This Encounter  Procedures  . Pro b natriuretic peptide  . Digitoxin level  . EKG 12-Lead  . ECHOCARDIOGRAM COMPLETE   No orders of the defined types were placed in this encounter.   Patient Instructions  Medication Instructions:  Your physician recommends that you continue on your current medications as directed. Please refer to the Current Medication list given to you today.  *If you need a refill on your cardiac medications before your next appointment, please call your pharmacy*   Lab Work: Pro BNP and Digoxin level today  If you have labs (blood work) drawn today and your tests are completely normal, you will receive your results only by: Marland Kitchen MyChart Message (if you have MyChart) OR . A paper copy in the mail If you have any lab test that is abnormal or we need to change your treatment, we will call you to review the results.   Testing/Procedures: Your physician has requested that you have an echocardiogram. Echocardiography is a painless test that uses sound waves to create images of your heart. It provides your doctor with information about the size and shape of your heart and how well your heart's chambers and valves are working. This procedure takes approximately one hour. There are no restrictions for this procedure.   Follow-Up: At [facility], you and your health needs are our priority.  As part of our continuing mission to provide you with exceptional heart care, we have created designated Provider Care  Teams.  These Care Teams include your primary Cardiologist (physician) and Advanced Practice  Providers (APPs -  Physician Assistants and Nurse Practitioners) who all work together to provide you with the care you need, when you need it.  We recommend signing up for the patient portal called "MyChart".  Sign up information is provided on this After Visit Summary.  MyChart is used to connect with patients for Virtual Visits (Telemedicine).  Patients are able to view lab/test results, encounter notes, upcoming appointments, etc.  Non-urgent messages can be sent to your provider as well.   To learn more about what you can do with MyChart, go to [email or web address].    Your next appointment:   6 month(s)  The format for your next appointment:   In Person  Provider:   You may see Sinclair Grooms, MD or one of the following Advanced Practice Providers on your designated Care Team:    Truitt Merle, NP  Cecilie Kicks, NP  Kathyrn Drown, NP    Other Instructions      Signed, Sinclair Grooms, MD  01/25/2020 11:18 AM    [place]

## 2020-01-25 ENCOUNTER — Ambulatory Visit: Payer: Medicare Other | Admitting: Interventional Cardiology

## 2020-01-25 ENCOUNTER — Other Ambulatory Visit: Payer: Self-pay

## 2020-01-25 ENCOUNTER — Encounter: Payer: Self-pay | Admitting: Interventional Cardiology

## 2020-01-25 VITALS — BP 115/78 | HR 95 | Temp 93.4°F | Ht 70.0 in | Wt 222.2 lb

## 2020-01-25 DIAGNOSIS — I5042 Chronic combined systolic (congestive) and diastolic (congestive) heart failure: Secondary | ICD-10-CM | POA: Diagnosis not present

## 2020-01-25 DIAGNOSIS — Z79899 Other long term (current) drug therapy: Secondary | ICD-10-CM

## 2020-01-25 DIAGNOSIS — I10 Essential (primary) hypertension: Secondary | ICD-10-CM

## 2020-01-25 DIAGNOSIS — I2510 Atherosclerotic heart disease of native coronary artery without angina pectoris: Secondary | ICD-10-CM

## 2020-01-25 DIAGNOSIS — I4819 Other persistent atrial fibrillation: Secondary | ICD-10-CM | POA: Diagnosis not present

## 2020-01-25 DIAGNOSIS — I25709 Atherosclerosis of coronary artery bypass graft(s), unspecified, with unspecified angina pectoris: Secondary | ICD-10-CM | POA: Diagnosis not present

## 2020-01-25 DIAGNOSIS — Z7189 Other specified counseling: Secondary | ICD-10-CM

## 2020-01-25 DIAGNOSIS — Z950 Presence of cardiac pacemaker: Secondary | ICD-10-CM

## 2020-01-25 DIAGNOSIS — R0602 Shortness of breath: Secondary | ICD-10-CM

## 2020-01-25 NOTE — Patient Instructions (Addendum)
Medication Instructions:  Your physician recommends that you continue on your current medications as directed. Please refer to the Current Medication list given to you today.  *If you need a refill on your cardiac medications before your next appointment, please call your pharmacy*   Lab Work: Pro BNP and Digoxin level today  If you have labs (blood work) drawn today and your tests are completely normal, you will receive your results only by: Marland Kitchen MyChart Message (if you have MyChart) OR . A paper copy in the mail If you have any lab test that is abnormal or we need to change your treatment, we will call you to review the results.   Testing/Procedures: Your physician has requested that you have an echocardiogram. Echocardiography is a painless test that uses sound waves to create images of your heart. It provides your doctor with information about the size and shape of your heart and how well your heart's chambers and valves are working. This procedure takes approximately one hour. There are no restrictions for this procedure.   Follow-Up: At [facility], you and your health needs are our priority.  As part of our continuing mission to provide you with exceptional heart care, we have created designated Provider Care Teams.  These Care Teams include your primary Cardiologist (physician) and Advanced Practice Providers (APPs -  Physician Assistants and Nurse Practitioners) who all work together to provide you with the care you need, when you need it.  We recommend signing up for the patient portal called "MyChart".  Sign up information is provided on this After Visit Summary.  MyChart is used to connect with patients for Virtual Visits (Telemedicine).  Patients are able to view lab/test results, encounter notes, upcoming appointments, etc.  Non-urgent messages can be sent to your provider as well.   To learn more about what you can do with MyChart, go to [email or web address].    Your next  appointment:   6 month(s)  The format for your next appointment:   In Person  Provider:   You may see Sinclair Grooms, MD or one of the following Advanced Practice Providers on your designated Care Team:    Truitt Merle, NP  Cecilie Kicks, NP  Kathyrn Drown, NP    Other Instructions

## 2020-01-28 LAB — DIGITOXIN LEVEL: Digitoxin Lvl: <5 ng/mL — ABNORMAL LOW (ref 10–25)

## 2020-01-28 LAB — PRO B NATRIURETIC PEPTIDE: NT-Pro BNP: 672 pg/mL — ABNORMAL HIGH (ref 0–486)

## 2020-01-30 ENCOUNTER — Other Ambulatory Visit: Payer: Medicare Other

## 2020-01-30 ENCOUNTER — Telehealth: Payer: Self-pay | Admitting: *Deleted

## 2020-01-30 DIAGNOSIS — I5042 Chronic combined systolic (congestive) and diastolic (congestive) heart failure: Secondary | ICD-10-CM

## 2020-01-30 MED ORDER — FUROSEMIDE 40 MG PO TABS: 40.0000 mg | ORAL_TABLET | Freq: Every day | ORAL | 3 refills | Status: DC

## 2020-01-30 NOTE — Telephone Encounter (Signed)
Spoke with pt and reviewed results and recommendations.  Pt agreeable with plan.  Will come for labs on 12/6.  Pt verbalized understanding and was appreciative for call.

## 2020-01-30 NOTE — Telephone Encounter (Signed)
-----   Message from Belva Crome, MD sent at 01/30/2020 12:29 PM EST ----- Let the patient know he should increase the furosemide to 40 mg daily and and check BMET in 1 week. Let him know I anticipate this decreasing his dyspnea. A copy will be sent to Lavone Orn, MD

## 2020-01-30 NOTE — Telephone Encounter (Signed)
Spoke with pt and made him aware that he is to take Furosemide 40mg  QD.  Advised ok to take two of his 20mg  tablets until he uses them up.  Pt appreciative for call.

## 2020-01-30 NOTE — Telephone Encounter (Signed)
Patient is following up. He states he would like to review instructions for how he needs to take Furosemide prior to BMET on 02/06/20.

## 2020-01-31 NOTE — Progress Notes (Signed)
Remote pacemaker transmission.   

## 2020-02-06 ENCOUNTER — Other Ambulatory Visit: Payer: Medicare Other

## 2020-02-07 ENCOUNTER — Other Ambulatory Visit: Payer: Medicare Other | Admitting: *Deleted

## 2020-02-07 ENCOUNTER — Other Ambulatory Visit: Payer: Self-pay

## 2020-02-07 DIAGNOSIS — I5042 Chronic combined systolic (congestive) and diastolic (congestive) heart failure: Secondary | ICD-10-CM

## 2020-02-07 LAB — BASIC METABOLIC PANEL
BUN/Creatinine Ratio: 22 (ref 10–24)
BUN: 28 mg/dL — ABNORMAL HIGH (ref 8–27)
CO2: 21 mmol/L (ref 20–29)
Calcium: 9.5 mg/dL (ref 8.6–10.2)
Chloride: 101 mmol/L (ref 96–106)
Creatinine, Ser: 1.28 mg/dL — ABNORMAL HIGH (ref 0.76–1.27)
GFR calc Af Amer: 59 mL/min/{1.73_m2} — ABNORMAL LOW (ref >59)
GFR calc non Af Amer: 51 mL/min/{1.73_m2} — ABNORMAL LOW (ref >59)
Glucose: 99 mg/dL (ref 65–99)
Potassium: 4.8 mmol/L (ref 3.5–5.2)
Sodium: 137 mmol/L (ref 134–144)

## 2020-03-07 ENCOUNTER — Ambulatory Visit (HOSPITAL_COMMUNITY): Payer: Medicare Other | Attending: Cardiology

## 2020-03-07 ENCOUNTER — Other Ambulatory Visit: Payer: Self-pay

## 2020-03-07 DIAGNOSIS — I5042 Chronic combined systolic (congestive) and diastolic (congestive) heart failure: Secondary | ICD-10-CM | POA: Diagnosis not present

## 2020-03-07 LAB — ECHOCARDIOGRAM COMPLETE
Area-P 1/2: 3.12 cm2
MV M vel: 4.23 m/s
MV Peak grad: 71.6 mmHg
S' Lateral: 4.4 cm

## 2020-03-12 ENCOUNTER — Other Ambulatory Visit: Payer: Self-pay | Admitting: *Deleted

## 2020-03-12 DIAGNOSIS — I5042 Chronic combined systolic (congestive) and diastolic (congestive) heart failure: Secondary | ICD-10-CM

## 2020-03-27 ENCOUNTER — Other Ambulatory Visit: Payer: Self-pay

## 2020-03-27 ENCOUNTER — Other Ambulatory Visit: Payer: Medicare Other | Admitting: *Deleted

## 2020-03-27 DIAGNOSIS — I5042 Chronic combined systolic (congestive) and diastolic (congestive) heart failure: Secondary | ICD-10-CM

## 2020-03-27 LAB — BASIC METABOLIC PANEL
BUN/Creatinine Ratio: 17 (ref 10–24)
BUN: 24 mg/dL (ref 8–27)
CO2: 24 mmol/L (ref 20–29)
Calcium: 9.6 mg/dL (ref 8.6–10.2)
Chloride: 99 mmol/L (ref 96–106)
Creatinine, Ser: 1.41 mg/dL — ABNORMAL HIGH (ref 0.76–1.27)
GFR calc Af Amer: 52 mL/min/{1.73_m2} — ABNORMAL LOW (ref >59)
GFR calc non Af Amer: 45 mL/min/{1.73_m2} — ABNORMAL LOW (ref >59)
Glucose: 104 mg/dL — ABNORMAL HIGH (ref 65–99)
Potassium: 4.3 mmol/L (ref 3.5–5.2)
Sodium: 138 mmol/L (ref 134–144)

## 2020-04-09 ENCOUNTER — Other Ambulatory Visit: Payer: Self-pay | Admitting: *Deleted

## 2020-04-09 ENCOUNTER — Telehealth: Payer: Self-pay | Admitting: Interventional Cardiology

## 2020-04-09 MED ORDER — DAPAGLIFLOZIN PROPANEDIOL 10 MG PO TABS: 10.0000 mg | ORAL_TABLET | Freq: Every day | ORAL | 11 refills | Status: DC

## 2020-04-09 NOTE — Telephone Encounter (Signed)
Follow Up:      Pt calling to get lab results from last week. He says he does not know how to get in My Chart to see his results.  He said he need to know what to do about his medicine, he said he did not have the name of the medicine.

## 2020-04-09 NOTE — Telephone Encounter (Signed)
Spoke with pt and made him aware of lab results and to continue current medications.  Pt appreciative for call.

## 2020-04-24 ENCOUNTER — Ambulatory Visit (INDEPENDENT_AMBULATORY_CARE_PROVIDER_SITE_OTHER): Payer: Medicare Other

## 2020-04-24 DIAGNOSIS — I639 Cerebral infarction, unspecified: Secondary | ICD-10-CM

## 2020-04-26 LAB — CUP PACEART REMOTE DEVICE CHECK
Battery Impedance: 2215 Ohm
Battery Remaining Longevity: 40 mo
Battery Voltage: 2.74 V
Brady Statistic RV Percent Paced: 11 %
Date Time Interrogation Session: 20220224124448
Implantable Lead Implant Date: 20090506
Implantable Lead Implant Date: 20090506
Implantable Lead Location: 753859
Implantable Lead Location: 753860
Implantable Lead Model: 4024
Implantable Pulse Generator Implant Date: 20090506
Lead Channel Impedance Value: 550 Ohm
Lead Channel Impedance Value: 67 Ohm
Lead Channel Pacing Threshold Amplitude: 0.625 V
Lead Channel Pacing Threshold Pulse Width: 0.4 ms
Lead Channel Setting Pacing Amplitude: 2.5 V
Lead Channel Setting Pacing Pulse Width: 0.4 ms
Lead Channel Setting Sensing Sensitivity: 2.8 mV

## 2020-05-02 NOTE — Progress Notes (Signed)
Remote pacemaker transmission.   

## 2020-05-06 ENCOUNTER — Other Ambulatory Visit: Payer: Self-pay | Admitting: Interventional Cardiology

## 2020-06-07 ENCOUNTER — Emergency Department (HOSPITAL_COMMUNITY): Payer: Medicare Other

## 2020-06-07 ENCOUNTER — Encounter (HOSPITAL_COMMUNITY): Payer: Self-pay

## 2020-06-07 ENCOUNTER — Other Ambulatory Visit: Payer: Self-pay

## 2020-06-07 ENCOUNTER — Emergency Department (HOSPITAL_COMMUNITY)
Admission: EM | Admit: 2020-06-07 | Discharge: 2020-06-07 | Disposition: A | Discharge status: Home / Self Care | Payer: Medicare Other | Attending: Emergency Medicine | Admitting: Emergency Medicine

## 2020-06-07 DIAGNOSIS — J111 Influenza due to unidentified influenza virus with other respiratory manifestations: Secondary | ICD-10-CM

## 2020-06-07 DIAGNOSIS — Z87891 Personal history of nicotine dependence: Secondary | ICD-10-CM | POA: Diagnosis not present

## 2020-06-07 DIAGNOSIS — Z950 Presence of cardiac pacemaker: Secondary | ICD-10-CM | POA: Diagnosis not present

## 2020-06-07 DIAGNOSIS — E039 Hypothyroidism, unspecified: Secondary | ICD-10-CM | POA: Insufficient documentation

## 2020-06-07 DIAGNOSIS — Z7901 Long term (current) use of anticoagulants: Secondary | ICD-10-CM | POA: Diagnosis not present

## 2020-06-07 DIAGNOSIS — Z85820 Personal history of malignant melanoma of skin: Secondary | ICD-10-CM | POA: Diagnosis not present

## 2020-06-07 DIAGNOSIS — I110 Hypertensive heart disease with heart failure: Secondary | ICD-10-CM | POA: Diagnosis not present

## 2020-06-07 DIAGNOSIS — J101 Influenza due to other identified influenza virus with other respiratory manifestations: Secondary | ICD-10-CM | POA: Insufficient documentation

## 2020-06-07 DIAGNOSIS — I25709 Atherosclerosis of coronary artery bypass graft(s), unspecified, with unspecified angina pectoris: Secondary | ICD-10-CM | POA: Diagnosis not present

## 2020-06-07 DIAGNOSIS — Z951 Presence of aortocoronary bypass graft: Secondary | ICD-10-CM | POA: Insufficient documentation

## 2020-06-07 DIAGNOSIS — I5042 Chronic combined systolic (congestive) and diastolic (congestive) heart failure: Secondary | ICD-10-CM | POA: Insufficient documentation

## 2020-06-07 DIAGNOSIS — R059 Cough, unspecified: Secondary | ICD-10-CM | POA: Diagnosis present

## 2020-06-07 DIAGNOSIS — Z20822 Contact with and (suspected) exposure to covid-19: Secondary | ICD-10-CM | POA: Diagnosis not present

## 2020-06-07 DIAGNOSIS — Z79899 Other long term (current) drug therapy: Secondary | ICD-10-CM | POA: Insufficient documentation

## 2020-06-07 LAB — CBC WITH DIFFERENTIAL/PLATELET
Abs Immature Granulocytes: 0.03 10*3/uL (ref 0.00–0.07)
Basophils Absolute: 0 10*3/uL (ref 0.0–0.1)
Basophils Relative: 1 %
Eosinophils Absolute: 0 10*3/uL (ref 0.0–0.5)
Eosinophils Relative: 0 %
HCT: 39.9 % (ref 39.0–52.0)
Hemoglobin: 12.8 g/dL — ABNORMAL LOW (ref 13.0–17.0)
Immature Granulocytes: 0 %
Lymphocytes Relative: 8 %
Lymphs Abs: 0.6 10*3/uL — ABNORMAL LOW (ref 0.7–4.0)
MCH: 31.3 pg (ref 26.0–34.0)
MCHC: 32.1 g/dL (ref 30.0–36.0)
MCV: 97.6 fL (ref 80.0–100.0)
Monocytes Absolute: 0.7 10*3/uL (ref 0.1–1.0)
Monocytes Relative: 9 %
Neutro Abs: 5.7 10*3/uL (ref 1.7–7.7)
Neutrophils Relative %: 82 %
Platelets: 202 10*3/uL (ref 150–400)
RBC: 4.09 MIL/uL — ABNORMAL LOW (ref 4.22–5.81)
RDW: 13.8 % (ref 11.5–15.5)
WBC: 7.1 10*3/uL (ref 4.0–10.5)
nRBC: 0 % (ref 0.0–0.2)

## 2020-06-07 LAB — COMPREHENSIVE METABOLIC PANEL
ALT: 15 U/L (ref 0–44)
AST: 22 U/L (ref 15–41)
Albumin: 4.1 g/dL (ref 3.5–5.0)
Alkaline Phosphatase: 84 U/L (ref 38–126)
Anion gap: 10 (ref 5–15)
BUN: 34 mg/dL — ABNORMAL HIGH (ref 8–23)
CO2: 25 mmol/L (ref 22–32)
Calcium: 9.2 mg/dL (ref 8.9–10.3)
Chloride: 98 mmol/L (ref 98–111)
Creatinine, Ser: 1.63 mg/dL — ABNORMAL HIGH (ref 0.61–1.24)
GFR, Estimated: 41 mL/min — ABNORMAL LOW (ref >60)
Glucose, Bld: 100 mg/dL — ABNORMAL HIGH (ref 70–99)
Potassium: 4.6 mmol/L (ref 3.5–5.1)
Sodium: 133 mmol/L — ABNORMAL LOW (ref 135–145)
Total Bilirubin: 1.4 mg/dL — ABNORMAL HIGH (ref 0.3–1.2)
Total Protein: 7.2 g/dL (ref 6.5–8.1)

## 2020-06-07 LAB — LACTIC ACID, PLASMA: Lactic Acid, Venous: 1.4 mmol/L (ref 0.5–1.9)

## 2020-06-07 LAB — RESP PANEL BY RT-PCR (FLU A&B, COVID) ARPGX2
Influenza A by PCR: POSITIVE — AB
Influenza B by PCR: NEGATIVE
SARS Coronavirus 2 by RT PCR: NEGATIVE

## 2020-06-07 MED ORDER — ALBUTEROL SULFATE HFA 108 (90 BASE) MCG/ACT IN AERS
2.0000 | INHALATION_SPRAY | RESPIRATORY_TRACT | Status: DC | PRN
  Administered 2020-06-07: 2 via RESPIRATORY_TRACT
  Filled 2020-06-07: qty 6.7

## 2020-06-07 MED ORDER — OSELTAMIVIR PHOSPHATE 30 MG PO CAPS: 30.0000 mg | ORAL_CAPSULE | Freq: Two times a day (BID) | ORAL | 0 refills | Status: DC

## 2020-06-07 MED ORDER — SODIUM CHLORIDE 0.9 % IV BOLUS
500.0000 mL | Freq: Once | INTRAVENOUS | Status: AC
Start: 2020-06-07 — End: 2020-06-07
  Administered 2020-06-07: 500 mL via INTRAVENOUS

## 2020-06-07 NOTE — ED Notes (Signed)
Please call [place], [phone], with any updates.

## 2020-06-07 NOTE — ED Provider Notes (Signed)
Why DEPT Provider Note   CSN: [id number] Arrival date & time: 06/07/20  1443     History Chief Complaint  Patient presents with  . Shortness of Breath  . Weakness  . Cough    JUNIEL GROENE is a 87 y.o. male.  Patient is a 87 year old male who presents with cough and shortness of breath.  He has a history of chronic A. fib, coronary artery disease, prior CVA, hypertension.  He states he had "the flu" recently.  Initially said it was a couple weeks ago and then he indicated that was about 2 to 3 months ago.  At that time he states that he took a Z-Pak and it got better.  He says it came back again about 2 days ago.  He has had nasal congestion, runny nose and coughing.  His cough is productive of clear to white phlegm.  He has been having some shortness of breath which he says is mostly at night when he is trying to sleep.  He has been sleeping more propped up.  He denies any increased leg swelling.  No chest pain.  No associated fevers.  No nausea or vomiting.  He called his PCP to get another Z-Pak but they advised him to come here for further evaluation.  He is complaining of some generalized weakness.        Past Medical History:  Diagnosis Date  . 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 Atrial fibrillation ([place])   . BPH (benign prostatic hyperplasia)   . Cardiac pacemaker in situ   . Chronic atrial fibrillation ([place])   . Coronary artery disease involving coronary bypass graft 1994   LIMA to RCA . Prior PCI on RCA with restenosis  . CVA (cerebral infarction) 1995  . CVA (cerebral infarction)    Right  . Erectile dysfunction   . GERD (gastroesophageal reflux disease)   . Hearing loss    hearing aids  . Herpes zoster   . HTN (hypertension)   . Hyperlipemia   . Hypothyroidism   . Long term (current) use of anticoagulants   . Melanoma [facility])    right ear, [place] 2002 and left arm 1997, [place]  . Primary  male hypogonadism     Patient Active Problem List   Diagnosis Date Noted  . Encounter for therapeutic drug monitoring 08/20/2016  . CVA (cerebral vascular accident) ([place]) 08/13/2016  . Chronic combined systolic and diastolic heart failure ([place]) 07/15/2016  . Essential hypertension 07/09/2016  . Coronary artery disease involving coronary bypass graft of native heart with angina pectoris ([place]) 12/06/2014  . Atrial fibrillation ([place]) 11/02/2013  . Sinus node dysfunction ([place]) 11/02/2013  . Pacemaker 11/02/2013    Past Surgical History:  Procedure Laterality Date  . RIGHT/LEFT HEART CATH AND CORONARY/GRAFT ANGIOGRAPHY N/A 08/26/2016   08/26/16- Patent bypass grafts       Family History  Problem Relation Age of Onset  . Heart disease Mother     Social History   Tobacco Use  . Smoking status: Former Smoker    Quit date: 08/01/1988    Years since quitting: 31.8  . Smokeless tobacco: Never Used  Vaping Use  . Vaping Use: Never used  Substance Use Topics  . Alcohol use: Yes    Comment: 2-3 times a week  . Drug use: No    Home Medications Prior to Admission medications   Medication Sig Start Date End Date  Taking? Authorizing Provider  acetaminophen (TYLENOL) 500 MG tablet Take 1,000 mg by mouth 2 (two) times daily as needed for moderate pain or headache.   Yes [provider]  atorvastatin (LIPITOR) 20 MG tablet Take 20 mg by mouth at bedtime.  02/10/15  Yes [provider]  carvedilol (COREG) 25 MG tablet TAKE 1 TABLET BY MOUTH TWICE DAILY. Patient taking differently: Take 25 mg by mouth 2 (two) times daily with a meal. 10/12/19  Yes Belva Crome, MD  Cholecalciferol (VITAMIN D3) 1000 units CAPS Take 1,000 Units by mouth daily.   Yes [provider]  colchicine 0.6 MG tablet Take 0.6 mg by mouth daily as needed (gout pain). On hold as of 10/08/16   Yes [provider]  dapagliflozin propanediol (FARXIGA) 10 MG TABS tablet Take 1 tablet (10 mg  total) by mouth daily before breakfast. 04/09/20  Yes Belva Crome, MD  digoxin (LANOXIN) 0.125 MG tablet TAKE (1) TABLET ON MONDAY, [place]. Patient taking differently: Take 0.125 mg by mouth See admin instructions. Takes 1 tablet on Monday, Wednesday and Friday 05/07/20  Yes Belva Crome, MD  ENTRESTO 24-26 MG TAKE 1 TABLET BY MOUTH TWICE DAILY. Patient taking differently: Take 1 tablet by mouth 2 (two) times daily. 12/12/19  Yes Belva Crome, MD  febuxostat (ULORIC) 40 MG tablet Take 40 mg by mouth daily. 04/16/20  Yes [provider]  furosemide (LASIX) 40 MG tablet Take 1 tablet (40 mg total) by mouth daily. 01/30/20  Yes Belva Crome, MD  levothyroxine (SYNTHROID, LEVOTHROID) 112 MCG tablet Take 112 mcg by mouth daily before breakfast.   Yes [provider]  Magnesium 250 MG TABS Take 250 mg by mouth daily.   Yes [provider]  oseltamivir (TAMIFLU) 30 MG capsule Take 1 capsule (30 mg total) by mouth 2 (two) times daily. 06/07/20  Yes Malvin Johns, MD  potassium chloride (KLOR-CON) 10 MEQ tablet TAKE 1 TABLET EACH DAY. Patient taking differently: Take 10 mEq by mouth daily. 12/12/19  Yes Belva Crome, MD  tamsulosin (FLOMAX) 0.4 MG CAPS capsule Take 0.4 mg by mouth every evening.  11/07/14  Yes [provider]  Testosterone (FORTESTA) 10 MG/ACT (2%) GEL Place 1 Dose onto the skin 3 (three) times daily.   Yes [provider]  warfarin (COUMADIN) 5 MG tablet Take 5 mg by mouth See admin instructions. Take 5mg s by mouth daily except fridays.   Yes [provider]    Allergies    Penicillins and Prednisone  Review of Systems   Review of Systems  Constitutional: Positive for fatigue. Negative for chills, diaphoresis and fever.  HENT: Positive for congestion and rhinorrhea. Negative for sneezing.   Eyes: Negative.   Respiratory: Positive for cough and shortness of breath. Negative for chest tightness.   Cardiovascular:  Negative for chest pain and leg swelling.  Gastrointestinal: Negative for abdominal pain, blood in stool, diarrhea, nausea and vomiting.  Genitourinary: Negative for difficulty urinating, flank pain, frequency and hematuria.  Musculoskeletal: Negative for arthralgias and back pain.  Skin: Negative for rash.  Neurological: Negative for dizziness, speech difficulty, weakness, numbness and headaches.    Physical Exam Updated Vital Signs BP 101/60   Pulse 62   Temp 98 F (36.7 C) (Oral)   Resp 16   Ht 5\' 10"  (1.778 m)   Wt 93.4 kg   SpO2 93%   BMI 29.56 kg/m   Physical Exam Constitutional:  Appearance: He is well-developed.  HENT:     Head: Normocephalic and atraumatic.  Eyes:     Pupils: Pupils are equal, round, and reactive to light.  Cardiovascular:     Rate and Rhythm: Normal rate and regular rhythm.     Heart sounds: Normal heart sounds.  Pulmonary:     Effort: Pulmonary effort is normal. No respiratory distress.     Breath sounds: Normal breath sounds. No wheezing or rales.  Chest:     Chest wall: No tenderness.  Abdominal:     General: Bowel sounds are normal.     Palpations: Abdomen is soft.     Tenderness: There is no abdominal tenderness. There is no guarding or rebound.  Musculoskeletal:        General: Normal range of motion.     Cervical back: Normal range of motion and neck supple.     Comments: No edema or calf tenderness  Lymphadenopathy:     Cervical: No cervical adenopathy.  Skin:    General: Skin is warm and dry.     Findings: No rash.  Neurological:     Mental Status: He is alert and oriented to person, place, and time.     ED Results / Procedures / Treatments   Labs (all labs ordered are listed, but only abnormal results are displayed) Labs Reviewed  RESP PANEL BY RT-PCR (FLU A&B, COVID) ARPGX2 - Abnormal; Notable for the following components:      Result Value   Influenza A by PCR POSITIVE (*)    All other components within normal  limits  COMPREHENSIVE METABOLIC PANEL - Abnormal; Notable for the following components:   Sodium 133 (*)    Glucose, Bld 100 (*)    BUN 34 (*)    Creatinine, Ser 1.63 (*)    Total Bilirubin 1.4 (*)    GFR, Estimated 41 (*)    All other components within normal limits  CBC WITH DIFFERENTIAL/PLATELET - Abnormal; Notable for the following components:   RBC 4.09 (*)    Hemoglobin 12.8 (*)    Lymphs Abs 0.6 (*)    All other components within normal limits  URINE CULTURE  LACTIC ACID, PLASMA  URINALYSIS, ROUTINE W REFLEX MICROSCOPIC  LACTIC ACID, PLASMA    EKG EKG Interpretation  Date/Time:  Thursday June 07 2020 14:52:28 EDT Ventricular Rate:  66 PR Interval:    QRS Duration: 179 QT Interval:  457 QTC Calculation: 479 R Axis:   -78 Text Interpretation: Ventricular-paced rhythm Confirmed by Malvin Johns [phone]) on 06/07/2020 4:17:38 PM   Radiology DG Chest 2 View  Result Date: 06/07/2020 CLINICAL DATA:  Shortness of breath. EXAM: CHEST - 2 VIEW COMPARISON:  September 27, 2008. FINDINGS: The heart size and mediastinal contours are within normal limits. Left-sided pacemaker is unchanged in position. No pneumothorax or pleural effusion is noted. Sternotomy wires are noted. Both lungs are clear. The visualized skeletal structures are unremarkable. IMPRESSION: No active cardiopulmonary disease. Electronically Signed   By: Marijo Conception M.D.   On: 06/07/2020 15:33    Procedures Procedures   Medications Ordered in ED Medications  albuterol (VENTOLIN HFA) 108 (90 Base) MCG/ACT inhaler 2 puff (2 puffs Inhalation Given 06/07/20 1634)  sodium chloride 0.9 % bolus 500 mL (500 mLs Intravenous New Bag/Given 06/07/20 1805)    ED Course  I have reviewed the triage vital signs and the nursing notes.  Pertinent labs & imaging results that were available during my care of the  patient were reviewed by me and considered in my medical decision making (see chart for details).    MDM  Rules/Calculators/A&P                          Patient presents with cough and congestion along with some generalized weakness and some intermittent shortness of breath.  No associated chest pain.  His chest x-ray is clear without evidence of pneumonia.  He has no hypoxia.  No focal deficits.  His labs show a slight increase in his creatinine from his baseline.  His sodium is slightly low.  His Covid test is negative but his influenza test is positive which is likely the etiology of his symptoms.  His blood pressures been soft with a systolic range between 90 in the low 100s.  Most recently 101/60.  I ordered some IV fluids but he does not want to stay any longer and does not want to get IV fluids.  He states his blood pressure is always on the low side and he does not feel that he needs any IV fluids.  He was discharged home in good condition.  He was able to ambulate without significant symptoms.  He was started on Tamiflu at a reduced dose due to his creatinine clearance of 50.  He was encouraged to follow-up with his primary care doctor if his symptoms are not improving.  Strict return precautions were given. Final Clinical Impression(s) / ED Diagnoses Final diagnoses:  Influenza    Rx / DC Orders ED Discharge Orders         Ordered    oseltamivir (TAMIFLU) 30 MG capsule  2 times daily        06/07/20 1923           Malvin Johns, MD 06/07/20 1925

## 2020-06-07 NOTE — ED Triage Notes (Signed)
Patient c/o a non productive cough x 2 days, SOB and weakness.

## 2020-06-07 NOTE — Discharge Instructions (Signed)
Follow-up with your primary care doctor if you are not feeling better within the next 4 to 5 days.  Return here as needed if you have any worsening symptoms including increased weakness, shortness of breath, chest pain, vomiting or other worsening symptoms.

## 2020-06-07 NOTE — ED Triage Notes (Signed)
Emergency Medicine Provider Triage Evaluation Note  Timothy Harper , a 87 y.o. male  was evaluated in triage.  Pt complains of 2-day history of generalized fatigue, weakness, shortness of breath and persistent cough.  States that he recovered from "the flu" few days ago and started feeling better but then 2 days ago got worse.  Reports diarrhea as well.  No chest pain or vomiting.  Review of Systems  Positive: Shortness of breath, fatigue and cough Negative: Chest pain, vomiting  Physical Exam  BP 105/62 (BP Location: Left Arm)   Pulse 65   Temp (!) 97.4 F (36.3 C) (Oral)   Resp 18   Ht 5\' 10"  (1.778 m)   Wt 93.4 kg   SpO2 98%   BMI 29.56 kg/m  Gen:   Awake, no distress   HEENT:  Atraumatic  Resp:  Normal effort  Cardiac:  Normal rate  Abd:   Nondistended, nontender  MSK:   Moves extremities without difficulty  Neuro:  Speech clear  Medical Decision Making  Medically screening exam initiated at 3:30 PM.  Appropriate orders placed.  Timothy Harper was informed that the remainder of the evaluation will be completed by another provider, this initial triage assessment does not replace that evaluation, and the importance of remaining in the ED until their evaluation is complete.  Clinical Impression  87 year old male presenting to the ED for generalized fatigue, weakness, shortness of breath and cough for the past 2 days.  Will order lab work in addition to chest x-ray and EKG.   Timothy Heady, PA-C 06/07/20 1448

## 2020-07-24 ENCOUNTER — Ambulatory Visit (INDEPENDENT_AMBULATORY_CARE_PROVIDER_SITE_OTHER): Payer: Medicare Other

## 2020-07-24 DIAGNOSIS — I4819 Other persistent atrial fibrillation: Secondary | ICD-10-CM

## 2020-07-24 NOTE — Progress Notes (Signed)
Cardiology Office Note:    Date:  07/25/2020   ID:  Timothy Harper, DOB 1933/01/12, MRN [id number]  PCP:  Lavone Orn, MD  Cardiologist:  Sinclair Grooms, MD   Referring MD: Lavone Orn, MD   Chief Complaint  Patient presents with  . Follow-up    CHF CAD A. Fib Anticoagulation therapy    History of Present Illness:    Timothy Harper is a 87 y.o. male with a hx of coronary artery disease with prior coronary bypass grafting and native vessel PCI, tachybradycardia syndrome with atrial fibrillation, hypertension, embolic CVA, chronic anticoagulation therapy, eventual development of systolic heart failure EF 30%, 2020 improving to 45%, 2022 on heart failure therapy.   He is doing well.  Breathing is improved.  LVEF has improved on heart failure therapy.  Blood pressures are relatively low today.  He does not feel dizzy or lightheaded.  He still sleeps in his recliner.  He is not short of breath.  He is able to lie flat.  Past Medical History:  Diagnosis Date  . 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 Atrial fibrillation ([place])   . BPH (benign prostatic hyperplasia)   . Cardiac pacemaker in situ   . Chronic atrial fibrillation ([place])   . Coronary artery disease involving coronary bypass graft 1994   LIMA to RCA . Prior PCI on RCA with restenosis  . CVA (cerebral infarction) 1995  . CVA (cerebral infarction)    Right  . Erectile dysfunction   . GERD (gastroesophageal reflux disease)   . Hearing loss    hearing aids  . Herpes zoster   . HTN (hypertension)   . Hyperlipemia   . Hypothyroidism   . Long term (current) use of anticoagulants   . Melanoma [employer])    right ear, Rosana Hoes 2002 and left arm 1997, [place]  . Primary male hypogonadism     Past Surgical History:  Procedure Laterality Date  . RIGHT/LEFT HEART CATH AND CORONARY/GRAFT ANGIOGRAPHY N/A 08/26/2016   08/26/16- Patent bypass grafts    Current Medications: Current Meds   Medication Sig  . acetaminophen (TYLENOL) 500 MG tablet Take 1,000 mg by mouth 2 (two) times daily as needed for moderate pain or headache.  Marland Kitchen atorvastatin (LIPITOR) 20 MG tablet Take 20 mg by mouth at bedtime.   . carvedilol (COREG) 25 MG tablet TAKE 1 TABLET BY MOUTH TWICE DAILY. (Patient taking differently: Take 25 mg by mouth 2 (two) times daily with a meal.)  . Cholecalciferol (VITAMIN D3) 1000 units CAPS Take 1,000 Units by mouth daily.  . colchicine 0.6 MG tablet Take 0.6 mg by mouth daily as needed (gout pain). On hold as of 10/08/16  . dapagliflozin propanediol (FARXIGA) 10 MG TABS tablet Take 1 tablet (10 mg total) by mouth daily before breakfast.  . digoxin (LANOXIN) 0.125 MG tablet TAKE (1) TABLET ON MONDAY, WEDNESDAY AND FRIDAY. (Patient taking differently: Take 0.125 mg by mouth See admin instructions. Takes 1 tablet on Monday, Wednesday and Friday)  . ENTRESTO 24-26 MG TAKE 1 TABLET BY MOUTH TWICE DAILY. (Patient taking differently: Take 1 tablet by mouth 2 (two) times daily.)  . febuxostat (ULORIC) 40 MG tablet Take 40 mg by mouth daily.  . furosemide (LASIX) 20 MG tablet Take 1 tablet (20 mg total) by mouth daily.  Marland Kitchen levothyroxine (SYNTHROID, LEVOTHROID) 112 MCG tablet Take 112 mcg by mouth daily before breakfast.  . Magnesium 250 MG TABS  Take 250 mg by mouth daily.  Marland Kitchen oseltamivir (TAMIFLU) 30 MG capsule Take 1 capsule (30 mg total) by mouth 2 (two) times daily.  . potassium chloride (KLOR-CON) 10 MEQ tablet TAKE 1 TABLET EACH DAY. (Patient taking differently: Take 10 mEq by mouth daily.)  . tamsulosin (FLOMAX) 0.4 MG CAPS capsule Take 0.4 mg by mouth every evening.   . Testosterone (FORTESTA) 10 MG/ACT (2%) GEL Place 1 Dose onto the skin 3 (three) times daily.  Marland Kitchen warfarin (COUMADIN) 5 MG tablet Take 5 mg by mouth See admin instructions. Take 5mg s by mouth daily except fridays.  . [DISCONTINUED] furosemide (LASIX) 40 MG tablet Take 1 tablet (40 mg total) by mouth daily.      Allergies:   Allopurinol, Penicillins, and Prednisone   Social History   Socioeconomic History  . Marital status: Married    Spouse name: Not on file  . Number of children: 4  . Years of education: Not on file  . Highest education level: Not on file  Occupational History  . Occupation: Music therapist    Comment: retired  Tobacco Use  . Smoking status: Former Smoker    Quit date: 08/01/1988    Years since quitting: 32.0  . Smokeless tobacco: Never Used  Vaping Use  . Vaping Use: Never used  Substance and Sexual Activity  . Alcohol use: Yes    Comment: 2-3 times a week  . Drug use: No  . Sexual activity: Not on file  Other Topics Concern  . Not on file  Social History Narrative  . Not on file   Social Determinants of Health   Financial Resource Strain: Not on file  Food Insecurity: Not on file  Transportation Needs: Not on file  Physical Activity: Not on file  Stress: Not on file  Social Connections: Not on file     Family History: The patient's family history includes Heart disease in his mother.  ROS:   Please see the history of present illness.    Stiff neck.  No arm weakness or dysesthesia.  All other systems reviewed and are negative.  EKGs/Labs/Other Studies Reviewed:    The following studies were reviewed today: 2D Doppler echocardiogram from January 2022: IMPRESSIONS    1. Left ventricular ejection fraction, by estimation, is 40 to 45%. Left  ventricular ejection fraction by 3D volume is 42 %. The left ventricle has  mildly decreased function. The left ventricle demonstrates global  hypokinesis. Left ventricular diastolic  parameters are consistent with Grade I diastolic dysfunction (impaired  relaxation).  2. Right ventricular systolic function is normal. The right ventricular  size is moderately enlarged. There is normal pulmonary artery systolic  pressure.  3. Left atrial size was moderately dilated.  4. Right atrial size was severely  dilated.  5. The mitral valve is normal in structure. Mild mitral valve  regurgitation. No evidence of mitral stenosis.  6. The aortic valve is tricuspid. Aortic valve regurgitation is not  visualized. Mild aortic valve sclerosis is present, with no evidence of  aortic valve stenosis.  7. The inferior vena cava is normal in size with greater than 50%  respiratory variability, suggesting right atrial pressure of 3 mmHg.   EKG:  EKG is not repeated today.  Recent Labs: 01/25/2020: NT-Pro BNP 672 06/07/2020: ALT 15; BUN 34; Creatinine, Ser 1.63; Hemoglobin 12.8; Platelets 202; Potassium 4.6; Sodium 133  Recent Lipid Panel No results found for: CHOL, TRIG, HDL, CHOLHDL, VLDL, LDLCALC, LDLDIRECT  Physical Exam:  VS:  BP (!) 90/58   Pulse 78   Ht 5\' 10"  (1.778 m)   Wt 209 lb 6.4 oz (95 kg)   SpO2 97%   BMI 30.05 kg/m     Wt Readings from Last 3 Encounters:  07/25/20 209 lb 6.4 oz (95 kg)  06/07/20 206 lb (93.4 kg)  01/25/20 222 lb 3.2 oz (100.8 kg)     GEN: Elderly. No acute distress HEENT: Normal NECK: No JVD. LYMPHATICS: No lymphadenopathy CARDIAC: No murmur. RRR no gallop, or edema. VASCULAR:  Normal Pulses. No bruits. RESPIRATORY:  Clear to auscultation without rales, wheezing or rhonchi  ABDOMEN: Soft, non-tender, non-distended, No pulsatile mass, MUSCULOSKELETAL: No deformity  SKIN: Warm and dry NEUROLOGIC:  Alert and oriented x 3 PSYCHIATRIC:  Normal affect   ASSESSMENT:    1. Chronic combined systolic and diastolic heart failure ([place])   2. Cerebrovascular accident (CVA), unspecified mechanism ([place])   3. Persistent atrial fibrillation ([place])   4. Essential hypertension   5. Coronary artery disease involving coronary bypass graft of native heart with angina pectoris ([place])   6. Cardiac pacemaker in situ   7. Encounter for therapeutic drug monitoring    PLAN:    In order of problems listed above:  1. EF is improved to mildly depressed with EF in the 40 to  45% range.  He is on 3 pillar therapy that includes carvedilol, Entresto, and [place].  Plan to decrease furosemide to 20 mg/day.  Check digoxin level, basic metabolic panel in 2 weeks. 2. No new neurological symptoms. 3. Rhythm is regular on exam today, therefore likely pacing. 4. Low blood pressure.  Decrease furosemide to 20 mg/day.  No other changes.  Blood work as noted above.   5. Last LDL was 79.  Continue Lipitor 20 mg/day. 6. Followed in the pacemaker clinic. 7. Continue to follow in the Coumadin clinic.   Guideline directed therapy for left ventricular systolic dysfunction: Angiotensin receptor-neprilysin inhibitor (ARNI)-Entresto; beta-blocker therapy - carvedilol, metoprolol succinate, or bisoprolol; mineralocorticoid receptor antagonist (MRA) therapy -spironolactone or eplerenone.  SGLT-2 agents -  Dapagliflozin [place]) or Empagliflozin ([place]).These therapies have been shown to improve clinical outcomes including reduction of rehospitalization, survival, and acute heart failure.    Medication Adjustments/Labs and Tests Ordered: Current medicines are reviewed at length with the patient today.  Concerns regarding medicines are outlined above.  Orders Placed This Encounter  Procedures  . Basic metabolic panel  . Pro b natriuretic peptide  . Digitoxin level   Meds ordered this encounter  Medications  . furosemide (LASIX) 20 MG tablet    Sig: Take 1 tablet (20 mg total) by mouth daily.    Dispense:  90 tablet    Refill:  3    Dose change    Patient Instructions  Medication Instructions:  1) DECREASE Furosemide to 20mg  once daily  *If you need a refill on your cardiac medications before your next appointment, please call your pharmacy*   Lab Work: BMET, Pro BNP and Digoxin level in 2 weeks.  If you have labs (blood work) drawn today and your tests are completely normal, you will receive your results only by: Marland Kitchen MyChart Message (if you have MyChart) OR . A paper  copy in the mail If you have any lab test that is abnormal or we need to change your treatment, we will call you to review the results.   Testing/Procedures: None   Follow-Up: At [facility], you and your health needs are our  priority.  As part of our continuing mission to provide you with exceptional heart care, we have created designated Provider Care Teams.  These Care Teams include your primary Cardiologist (physician) and Advanced Practice Providers (APPs -  Physician Assistants and Nurse Practitioners) who all work together to provide you with the care you need, when you need it.  We recommend signing up for the patient portal called "MyChart".  Sign up information is provided on this After Visit Summary.  MyChart is used to connect with patients for Virtual Visits (Telemedicine).  Patients are able to view lab/test results, encounter notes, upcoming appointments, etc.  Non-urgent messages can be sent to your provider as well.   To learn more about what you can do with MyChart, go to [email or web address].    Your next appointment:   9 month(s)  The format for your next appointment:   In Person  Provider:   You may see Sinclair Grooms, MD or one of the following Advanced Practice Providers on your designated Care Team:    Kathyrn Drown, NP    Other Instructions      Signed, Sinclair Grooms, MD  07/25/2020 10:30 AM    [place]

## 2020-07-25 ENCOUNTER — Other Ambulatory Visit: Payer: Self-pay

## 2020-07-25 ENCOUNTER — Encounter: Payer: Self-pay | Admitting: Interventional Cardiology

## 2020-07-25 ENCOUNTER — Ambulatory Visit: Payer: Medicare Other | Admitting: Interventional Cardiology

## 2020-07-25 VITALS — BP 90/58 | HR 78 | Ht 70.0 in | Wt 209.4 lb

## 2020-07-25 DIAGNOSIS — I25709 Atherosclerosis of coronary artery bypass graft(s), unspecified, with unspecified angina pectoris: Secondary | ICD-10-CM

## 2020-07-25 DIAGNOSIS — I10 Essential (primary) hypertension: Secondary | ICD-10-CM | POA: Diagnosis not present

## 2020-07-25 DIAGNOSIS — I639 Cerebral infarction, unspecified: Secondary | ICD-10-CM | POA: Diagnosis not present

## 2020-07-25 DIAGNOSIS — I5042 Chronic combined systolic (congestive) and diastolic (congestive) heart failure: Secondary | ICD-10-CM | POA: Diagnosis not present

## 2020-07-25 DIAGNOSIS — Z950 Presence of cardiac pacemaker: Secondary | ICD-10-CM

## 2020-07-25 DIAGNOSIS — I4819 Other persistent atrial fibrillation: Secondary | ICD-10-CM | POA: Diagnosis not present

## 2020-07-25 DIAGNOSIS — Z5181 Encounter for therapeutic drug level monitoring: Secondary | ICD-10-CM

## 2020-07-25 LAB — CUP PACEART REMOTE DEVICE CHECK
Battery Impedance: 2408 Ohm
Battery Remaining Longevity: 38 mo
Battery Voltage: 2.73 V
Brady Statistic RV Percent Paced: 12 %
Date Time Interrogation Session: 20220524153517
Implantable Lead Implant Date: 20090506
Implantable Lead Implant Date: 20090506
Implantable Lead Location: 753859
Implantable Lead Location: 753860
Implantable Lead Model: 4024
Implantable Pulse Generator Implant Date: 20090506
Lead Channel Impedance Value: 590 Ohm
Lead Channel Impedance Value: 67 Ohm
Lead Channel Pacing Threshold Amplitude: 0.875 V
Lead Channel Pacing Threshold Pulse Width: 0.4 ms
Lead Channel Setting Pacing Amplitude: 2.5 V
Lead Channel Setting Pacing Pulse Width: 0.4 ms
Lead Channel Setting Sensing Sensitivity: 2.8 mV

## 2020-07-25 MED ORDER — FUROSEMIDE 20 MG PO TABS: 20.0000 mg | ORAL_TABLET | Freq: Every day | ORAL | 3 refills | Status: DC

## 2020-07-25 NOTE — Patient Instructions (Signed)
Medication Instructions:  1) DECREASE Furosemide to 20mg  once daily  *If you need a refill on your cardiac medications before your next appointment, please call your pharmacy*   Lab Work: BMET, Pro BNP and Digoxin level in 2 weeks.  If you have labs (blood work) drawn today and your tests are completely normal, you will receive your results only by: Marland Kitchen MyChart Message (if you have MyChart) OR . A paper copy in the mail If you have any lab test that is abnormal or we need to change your treatment, we will call you to review the results.   Testing/Procedures: None   Follow-Up: At [facility], you and your health needs are our priority.  As part of our continuing mission to provide you with exceptional heart care, we have created designated Provider Care Teams.  These Care Teams include your primary Cardiologist (physician) and Advanced Practice Providers (APPs -  Physician Assistants and Nurse Practitioners) who all work together to provide you with the care you need, when you need it.  We recommend signing up for the patient portal called "MyChart".  Sign up information is provided on this After Visit Summary.  MyChart is used to connect with patients for Virtual Visits (Telemedicine).  Patients are able to view lab/test results, encounter notes, upcoming appointments, etc.  Non-urgent messages can be sent to your provider as well.   To learn more about what you can do with MyChart, go to [email or web address].    Your next appointment:   9 month(s)  The format for your next appointment:   In Person  Provider:   You may see Sinclair Grooms, MD or one of the following Advanced Practice Providers on your designated Care Team:    Kathyrn Drown, NP    Other Instructions

## 2020-08-02 ENCOUNTER — Other Ambulatory Visit: Payer: Self-pay | Admitting: Interventional Cardiology

## 2020-08-08 ENCOUNTER — Other Ambulatory Visit: Payer: Medicare Other | Admitting: *Deleted

## 2020-08-08 ENCOUNTER — Other Ambulatory Visit: Payer: Self-pay

## 2020-08-08 ENCOUNTER — Other Ambulatory Visit: Payer: Self-pay | Admitting: *Deleted

## 2020-08-08 DIAGNOSIS — I10 Essential (primary) hypertension: Secondary | ICD-10-CM

## 2020-08-08 DIAGNOSIS — I5042 Chronic combined systolic (congestive) and diastolic (congestive) heart failure: Secondary | ICD-10-CM

## 2020-08-08 DIAGNOSIS — Z5181 Encounter for therapeutic drug level monitoring: Secondary | ICD-10-CM

## 2020-08-09 LAB — BASIC METABOLIC PANEL
BUN/Creatinine Ratio: 15 (ref 10–24)
BUN: 18 mg/dL (ref 8–27)
CO2: 22 mmol/L (ref 20–29)
Calcium: 9.4 mg/dL (ref 8.6–10.2)
Chloride: 103 mmol/L (ref 96–106)
Creatinine, Ser: 1.19 mg/dL (ref 0.76–1.27)
Glucose: 81 mg/dL (ref 65–99)
Potassium: 4.1 mmol/L (ref 3.5–5.2)
Sodium: 140 mmol/L (ref 134–144)
eGFR: 59 mL/min/{1.73_m2} — ABNORMAL LOW (ref >59)

## 2020-08-09 LAB — PRO B NATRIURETIC PEPTIDE: NT-Pro BNP: 831 pg/mL — ABNORMAL HIGH (ref 0–486)

## 2020-08-09 LAB — DIGOXIN LEVEL: Digoxin, Serum: 0.6 ng/mL (ref 0.5–0.9)

## 2020-08-15 NOTE — Progress Notes (Signed)
Remote pacemaker transmission.   

## 2020-08-27 ENCOUNTER — Other Ambulatory Visit: Payer: Self-pay | Admitting: Interventional Cardiology

## 2020-09-10 ENCOUNTER — Ambulatory Visit: Payer: Medicare Other | Attending: Physician Assistant

## 2020-09-10 ENCOUNTER — Other Ambulatory Visit: Payer: Self-pay

## 2020-09-10 DIAGNOSIS — R29898 Other symptoms and signs involving the musculoskeletal system: Secondary | ICD-10-CM | POA: Insufficient documentation

## 2020-09-10 DIAGNOSIS — R293 Abnormal posture: Secondary | ICD-10-CM | POA: Diagnosis present

## 2020-09-10 DIAGNOSIS — M436 Torticollis: Secondary | ICD-10-CM | POA: Diagnosis present

## 2020-09-10 NOTE — Patient Instructions (Signed)
Access Code: [email or web address] URL: [email or web address] Date: 09/10/2020 Prepared by: Rich Number  Exercises Seated Scapular Retraction - 2 x daily - 7 x weekly - 1 sets - 10 reps - 3 hold Supine Chin Tucks on Flat Ball - 2 x daily - 7 x weekly - 1 sets - 10 reps - 3 hold Supine Cervical Rotation AROM on Pillow - 2 x daily - 7 x weekly - 1 sets - 10 reps - 2 hold Seated Levator Scapulae Stretch - 1 x daily - 7 x weekly - 3 sets - 10 reps

## 2020-09-10 NOTE — Therapy (Signed)
[place], [place], [address] Phone: [phone]   Fax:  [phone]  Physical Therapy Evaluation  Patient Details  Name: Timothy Harper MRN: [id number] Date of Birth: 1932-06-14 Referring Provider (PT): [place], [place], [place]   Encounter Date: 09/10/2020   PT End of Session - 09/10/20 1031     Visit Number 1    Number of Visits 8    Date for PT Re-Evaluation 11/05/20    Authorization Type [place] Time Period 6th visit FOTO - 10th visit progress note and FOTO    Progress Note Due on Visit 10    PT Start Time 0848    PT Stop Time 0930    PT Time Calculation (min) 42 min    Activity Tolerance Patient tolerated treatment well    Behavior During Therapy [facility] for tasks assessed/performed             Past Medical History:  Diagnosis Date   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 Prior PCI on RCA with restenosis   CVA (cerebral infarction) 1995   CVA (cerebral infarction)    Right   Erectile dysfunction    GERD (gastroesophageal reflux disease)    Hearing loss    hearing aids   Herpes zoster    HTN (hypertension)    Hyperlipemia    Hypothyroidism    Long term (current) use of anticoagulants    Melanoma ([place])    right ear, Rosana Hoes 2002 and left arm 1997, [place]   Primary male hypogonadism     Past Surgical History:  Procedure Laterality Date   RIGHT/LEFT HEART CATH AND CORONARY/GRAFT ANGIOGRAPHY N/A 08/26/2016   08/26/16- Patent bypass grafts    There were no vitals filed for this visit.    Subjective Assessment - 09/10/20 0851     Subjective The patient reports that the tightness in his neck moves around.  It started about 3-4 months  ago and is more on the right anterior side of the neck.  He reports no injury and he thinks that he was sleeping wrong.  He reports having pneumonia and fluid in the lungs and he had difficulty sleeping.  He had to sleep elevated on the couch and he thinks that is part of it.  He reports that he will use high walking stick with going on longer walks in the right hand and that may have something to do with the right neck tightness.  The patient states that since seeing Olivia, the pain has gotten better.  He has been using a firmer pillow.  He reports that the stiffness is constant and will increase with activities.  He denies headaches.  He feels that his head flops around and is weak.  The patient reports more tightness then pain.  He has difficulty turning his head at times.    Patient is accompained by: Family member    Limitations Walking;Sitting;Reading;Lifting    Patient Stated Goals To get some exercises to help.    Currently in Pain? Yes    Pain Score 2     Pain Location Neck    Pain Orientation Right    Pain  Descriptors / Indicators Tightness    Pain Type Acute pain    Pain Onset More than a month ago    Aggravating Factors  walking, moving around during the day.    Pain Relieving Factors massage, firm pillow                [facility] PT Assessment - 09/10/20 0001       Assessment   Medical Diagnosis M43.6 (ICD-10-CM) - Torticollis    Referring Provider (PT) [place], Delrae Rend, PA    Onset Date/Surgical Date --   3-4 months ago   Hand Dominance Right    Prior Therapy None for this condition      Precautions   Precautions ICD/Pacemaker      Restrictions   Weight Bearing Restrictions No      Balance Screen   Has the patient fallen in the past 6 months No    Has the patient had a decrease in activity level because of a fear of falling?  No    Is the patient reluctant to leave their home because of a fear of falling?  No      Home Environment   Living Environment Private  residence    Living Arrangements Spouse/significant other;Children    Type of [place] Access Level entry    [place] None      Prior Function   Level of Independence Independent with basic ADLs    Vocation Retired    Leisure walking, play cards,      Cognition   Overall Cognitive Status Within Functional Limits for tasks assessed      Observation/Other Assessments   Focus on Therapeutic Outcomes (FOTO)  50% Neck - Predicted 64%      Posture/Postural Control   Posture/Postural Control Postural limitations    Postural Limitations Rounded Shoulders;Forward head;Increased thoracic kyphosis;Decreased lumbar lordosis      ROM / Strength   AROM / PROM / Strength AROM      AROM   Right Shoulder Flexion 110 Degrees    Left Shoulder Flexion 110 Degrees    Cervical Flexion 55    Cervical Extension 5    Cervical - Right Side Bend 8    Cervical - Left Side Bend 4    Cervical - Right Rotation 25    Cervical - Left Rotation 40      Palpation   Spinal mobility Generalized hypomobility thoracic/cervical spine      Spurling's   Comment unable to assess due to neck stiffness      Bed Mobility   Supine to Sit Minimal Assistance - Patient > 75%      Ambulation/Gait   Ambulation/Gait Yes    Ambulation/Gait Assistance 7: Independent    Ambulation Distance (Feet) 100 Feet    Assistive device None    Gait Pattern Step-through pattern;Decreased step length - right;Decreased step length - left;Decreased trunk rotation;Trunk flexed                        Objective measurements completed on examination: See above findings.       [place] Adult PT Treatment/Exercise - 09/10/20 0001       Exercises   Exercises Neck      Neck Exercises: Seated   Other Seated Exercise levator scapula stretch 20" x 2 Bilat    Other Seated Exercise scapular retraction 3" x 10  Neck Exercises: Supine   Neck Retraction 10 reps;3 secs     Cervical Rotation Right;Left;10 reps                    PT Education - 09/10/20 1031     Education Details Presentation, POC, HEP    Person(s) Educated Patient;Child(ren)    Methods Explanation;Handout    Comprehension Verbalized understanding              PT Short Term Goals - 09/10/20 0907       PT SHORT TERM GOAL #1   Title The patient will be independent with a basic HEP    Baseline no HEP    Time 2    Period Weeks    Status New    Target Date 09/24/20               PT Long Term Goals - 09/10/20 0907       PT LONG TERM GOAL #1   Title The patient will present with improvement of of neck rotation motion to 50degrees bilaterally for safety with gait.    Baseline 25 deg and 45 deg    Time 8    Period Weeks    Target Date 11/05/20      PT LONG TERM GOAL #2   Title The patient will present with improvement of neck extension AROM for looking up to 20 degrees    Baseline 5 deg    Time 8    Period Weeks    Target Date 11/05/20      PT LONG TERM GOAL #3   Title The patient will be able to reach to 120 degrees of bilateral shoulder flexion motion to decrease strain on neck musculature.    Baseline 110 degrees    Time 8    Period Weeks    Status New    Target Date 11/05/20      PT LONG TERM GOAL #4   Title The patient will have an improved FOTO score to 64% for functional activities    Baseline 50%    Time 8    Period Weeks    Target Date 11/05/20                    Plan - 09/10/20 0938     Clinical Impression Statement Elijio is a 87 y/o male who reports onset of right neck tightness about 3 months ago.  The patient reports that he had pneumonia and had to sleep elevated.  He thinks this is the cause of the neck tightness.  His tightness with increase with activity including walking with his walking stick.  The patient presents with generalized hypomobility in the thoracic and cervcial spine.  He presents with an increase of  thoracic kyphosis and he sits with a rounded shoulder / forward head posture.  He presents with a signficant reduction of cervical AROM into all directions and he is limited with functional reaching overhead to 110 degrees bilaterally.  There is hypertonicity and muscle guarding of the SCM, scalenes, and cervcial musculature.  Recommend physical therapy for improvement of posture, neck range of motion, neck/upper back stabilization, and joint mobility for improvement of functional activities.    Personal Factors and Comorbidities Age;Comorbidity 1    Comorbidities Chronic Heart Failure (pacemaker)    Examination-Activity Limitations Bathing;Bed Mobility;Dressing;Hygiene/Grooming;Lift;Stand;Reach Overhead;Transfers    Examination-Participation Restrictions Cleaning;Driving;Laundry;Yard Work;Shop;Meal Prep    Stability/Clinical Decision Making Stable/Uncomplicated    Clinical Decision  Making Low    Rehab Potential Good    PT Frequency 2x / week    PT Duration 8 weeks    PT Treatment/Interventions ADLs/Self Care Home Management;Moist Heat;Electrical Stimulation;Ultrasound;Gait training;Functional mobility training;Therapeutic activities;Therapeutic exercise;Balance training;Neuromuscular re-education;Patient/family education;Manual techniques;Joint Manipulations;Spinal Manipulations;Dry needling;Taping    PT Next Visit Plan review FOTO, cervical/thoracic joint mobs, pecotralis stretches, shoulder flexion stretches, pulleys, STM Neck musculature, sleeping positioning/pillows    PT Home Exercise Plan Access Code: [email or web address]    Consulted and Agree with Plan of Care Patient;Family member/caregiver    Family Member Consulted Son             Patient will benefit from skilled therapeutic intervention in order to improve the following deficits and impairments:  Abnormal gait, Decreased range of motion, Impaired UE functional use, Hypomobility, Impaired flexibility, Improper body mechanics, Decreased  mobility, Postural dysfunction  Visit Diagnosis: Neck stiffness  Decreased range of motion of neck  Abnormal posture     Problem List Patient Active Problem List   Diagnosis Date Noted   Encounter for therapeutic drug monitoring 08/20/2016   CVA (cerebral vascular accident) ([place]) 08/13/2016   Chronic combined systolic and diastolic heart failure ([place]) 07/15/2016   Essential hypertension 07/09/2016   Coronary artery disease involving coronary bypass graft of native heart with angina pectoris ([place]) 12/06/2014   Atrial fibrillation ([place]) 11/02/2013   Sinus node dysfunction ([place]) 11/02/2013   Pacemaker 11/02/2013   Rich Number, PT, DPT, OCS, Crt. DN  Bethena Midget 09/10/2020, 10:43 AM  [facility] [address] [place], [place], [address] Phone: [phone]   Fax:  [phone]  Name: GERIK COBERLY MRN: [id number] Date of Birth: 07-17-1932

## 2020-09-18 ENCOUNTER — Ambulatory Visit: Payer: Medicare Other

## 2020-09-18 ENCOUNTER — Other Ambulatory Visit: Payer: Self-pay

## 2020-09-18 DIAGNOSIS — M436 Torticollis: Secondary | ICD-10-CM

## 2020-09-18 DIAGNOSIS — R293 Abnormal posture: Secondary | ICD-10-CM

## 2020-09-18 DIAGNOSIS — R29898 Other symptoms and signs involving the musculoskeletal system: Secondary | ICD-10-CM

## 2020-09-18 NOTE — Therapy (Addendum)
[place], [place], [address] Phone: [phone]   Fax:  [phone]  Physical Therapy Treatment  Patient Details  Name: Timothy Harper MRN: [id number] Date of Birth: November 15, 1932 Referring Provider (PT): Mar[place], [place], [place]   Encounter Date: 09/18/2020   PT End of Session - 09/18/20 1417     Visit Number 2    Number of Visits 16    Date for PT Re-Evaluation 11/05/20    Authorization Type [place] Time Period 6th visit FOTO - 10th visit progress note and FOTO    Progress Note Due on Visit 10    PT Start Time 1416    PT Stop Time 1501    PT Time Calculation (min) 45 min    Activity Tolerance Patient tolerated treatment well    Behavior During Therapy [facility] for tasks assessed/performed             Past Medical History:  Diagnosis Date   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 Prior PCI on RCA with restenosis   CVA (cerebral infarction) 1995   CVA (cerebral infarction)    Right   Erectile dysfunction    GERD (gastroesophageal reflux disease)    Hearing loss    hearing aids   Herpes zoster    HTN (hypertension)    Hyperlipemia    Hypothyroidism    Long term (current) use of anticoagulants    Melanoma ([place])    right ear, Rosana Hoes 2002 and left arm 1997, [place]   Primary male hypogonadism     Past Surgical History:  Procedure Laterality Date   RIGHT/LEFT HEART CATH AND CORONARY/GRAFT ANGIOGRAPHY N/A 08/26/2016   08/26/16- Patent bypass grafts    There were no vitals filed for this visit.   Subjective Assessment - 09/18/20 1438     Subjective The patient reports that he has been doing the exercises, but is not sure that he is doing them  right.  He feels a bit better.    Limitations Walking;Sitting;Reading;Lifting                [facility] PT Assessment - 09/18/20 0001       Assessment   Medical Diagnosis M43.6 (ICD-10-CM) - Torticollis    Referring Provider (PT) Clelland, Delrae Rend, PA    Onset Date/Surgical Date --   3-4 months ago   Hand Dominance Right    Prior Therapy None for this condition      Precautions   Precautions ICD/Pacemaker      AROM   Cervical Extension 10    Cervical - Right Rotation 25    Cervical - Left Rotation 40                           [facility] Adult PT Treatment/Exercise - 09/18/20 0001       Neck Exercises: Seated   Neck Retraction 10 reps;3 secs    Cervical Rotation 5 reps;Left;Right    Cervical Rotation Limitations w/ thoracic 10" hold    Other Seated Exercise scapular retraction 3" x 10      Neck Exercises: Supine   Neck  Retraction 10 reps;3 secs    Cervical Rotation Right;Left;10 reps      Manual Therapy   Manual Therapy Joint mobilization;Manual Traction    Joint Mobilization T1-T10 PA grade III    Manual Traction cervical traction      Neck Exercises: Stretches   Levator Stretch 20 seconds;2 reps;Left;Right    Warehouse manager 30 seconds;3 reps                    PT Education - 09/18/20 1501     Education Details POC and updated HEP    Person(s) Educated Patient    Methods Explanation;Handout    Comprehension Verbalized understanding              PT Short Term Goals - 09/10/20 0907       PT SHORT TERM GOAL #1   Title The patient will be independent with a basic HEP    Baseline no HEP    Time 2    Period Weeks    Status New    Target Date 09/24/20               PT Long Term Goals - 09/10/20 0907       PT LONG TERM GOAL #1   Title The patient will present with improvement of of neck rotation motion to 50degrees bilaterally for safety with gait.    Baseline 25 deg and 45 deg    Time 8    Period Weeks    Target  Date 11/05/20      PT LONG TERM GOAL #2   Title The patient will present with improvement of neck extension AROM for looking up to 20 degrees    Baseline 5 deg    Time 8    Period Weeks    Target Date 11/05/20      PT LONG TERM GOAL #3   Title The patient will be able to reach to 120 degrees of bilateral shoulder flexion motion to decrease strain on neck musculature.    Baseline 110 degrees    Time 8    Period Weeks    Status New    Target Date 11/05/20      PT LONG TERM GOAL #4   Title The patient will have an improved FOTO score to 64% for functional activities    Baseline 50%    Time 8    Period Weeks    Target Date 11/05/20                   Plan - 09/18/20 1439     Clinical Impression Statement [name] continues to have limits of cervical rotation motion. His extension motion did improve today.  Reviewed the patient's HEP and added pectoralis door stretch and supine Tbar flexion.  The patient tolerated these well.  Added thoracic joint mobs and cervical traction to improve neck motion.  Recommend progression of neck motion and postural strengthening for driving and walking safety.    Personal Factors and Comorbidities Age;Comorbidity 1    Comorbidities Chronic Heart Failure (pacemaker)    Examination-Activity Limitations Bathing;Bed Mobility;Dressing;Hygiene/Grooming;Lift;Stand;Reach Overhead;Transfers    Examination-Participation Restrictions Cleaning;Driving;Laundry;Yard Work;Shop;Meal Prep    PT Treatment/Interventions ADLs/Self Care Home Management;Moist Heat;Electrical Stimulation;Ultrasound;Gait training;Functional mobility training;Therapeutic activities;Therapeutic exercise;Balance training;Neuromuscular re-education;Patient/family education;Manual techniques;Joint Manipulations;Spinal Manipulations;Dry needling;Taping    PT Next Visit Plan review FOTO, cervical/thoracic joint mobs, pecotralis stretches, shoulder flexion stretches, pulleys, STM Neck musculature,  sleeping positioning/pillows    PT Home Exercise Plan  Access Code: [email or web address]    [email or web address] and Agree with Plan of Care Patient             Patient will benefit from skilled therapeutic intervention in order to improve the following deficits and impairments:  Abnormal gait, Decreased range of motion, Impaired UE functional use, Hypomobility, Impaired flexibility, Improper body mechanics, Decreased mobility, Postural dysfunction  Visit Diagnosis: Neck stiffness  Decreased range of motion of neck  Abnormal posture     Problem List Patient Active Problem List   Diagnosis Date Noted   Encounter for therapeutic drug monitoring 08/20/2016   CVA (cerebral vascular accident) ([place]) 08/13/2016   Chronic combined systolic and diastolic heart failure ([place]) 07/15/2016   Essential hypertension 07/09/2016   Coronary artery disease involving coronary bypass graft of native heart with angina pectoris ([place]) 12/06/2014   Atrial fibrillation ([place]) 11/02/2013   Sinus node dysfunction ([place]) 11/02/2013   Pacemaker 11/02/2013   Rich Number, PT, DPT, OCS, Crt. DN  Bethena Midget 09/18/2020, 4:40 PM  [facility] [address] [place], [place], [address] Phone: [phone]   Fax:  [phone]  Name: Timothy Harper MRN: [id number] Date of Birth: 1932-05-18

## 2020-09-18 NOTE — Patient Instructions (Signed)
Access Code: [email or web address] URL: [email or web address] Date: 09/18/2020 Prepared by: Rich Number  Exercises Seated Scapular Retraction - 2 x daily - 7 x weekly - 1 sets - 10 reps - 3 hold Supine Chin Tucks on Flat Ball - 2 x daily - 7 x weekly - 1 sets - 10 reps - 3 hold Supine Cervical Rotation AROM on Pillow - 2 x daily - 7 x weekly - 1 sets - 10 reps - 2 hold Seated Levator Scapulae Stretch - 1 x daily - 7 x weekly - 1 sets - 3 reps - 20 hold Doorway Pec Stretch at 60 Elevation - 1 x daily - 7 x weekly - 1 sets - 3 reps - 20 hold Supine Shoulder Flexion with Dowel - 1 x daily - 7 x weekly - 1 sets - 15 reps - 5 hold

## 2020-09-21 ENCOUNTER — Ambulatory Visit: Payer: Medicare Other

## 2020-09-21 ENCOUNTER — Other Ambulatory Visit: Payer: Self-pay

## 2020-09-21 DIAGNOSIS — R29898 Other symptoms and signs involving the musculoskeletal system: Secondary | ICD-10-CM

## 2020-09-21 DIAGNOSIS — M436 Torticollis: Secondary | ICD-10-CM

## 2020-09-21 DIAGNOSIS — R293 Abnormal posture: Secondary | ICD-10-CM

## 2020-09-21 NOTE — Therapy (Signed)
[place], [place], [address] Phone: [phone]   Fax:  [phone]  Physical Therapy Treatment  Patient Details  Name: Timothy Harper MRN: [id number] Date of Birth: 1933-01-05 Referring Provider (PT): [place], [place], [place]   Encounter Date: 09/21/2020   PT End of Session - 09/21/20 1013     Visit Number 3    Number of Visits 16    Date for PT Re-Evaluation 11/05/20    Authorization Type [place] Time Period 6th visit FOTO - 10th visit progress note and FOTO    PT Start Time 1014    PT Stop Time 1058    PT Time Calculation (min) 44 min    Activity Tolerance Patient tolerated treatment well    Behavior During Therapy [facility] for tasks assessed/performed             Past Medical History:  Diagnosis Date   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 Prior PCI on RCA with restenosis   CVA (cerebral infarction) 1995   CVA (cerebral infarction)    Right   Erectile dysfunction    GERD (gastroesophageal reflux disease)    Hearing loss    hearing aids   Herpes zoster    HTN (hypertension)    Hyperlipemia    Hypothyroidism    Long term (current) use of anticoagulants    Melanoma ([place])    right ear, Rosana Hoes 2002 and left arm 1997, [place]   Primary male hypogonadism     Past Surgical History:  Procedure Laterality Date   RIGHT/LEFT HEART CATH AND CORONARY/GRAFT ANGIOGRAPHY N/A 08/26/2016   08/26/16- Patent bypass grafts    There were no vitals filed for this visit.   Subjective Assessment - 09/21/20 1101     Subjective Patient reports that he is still not sure if he is doing the exercises right.  He still has stiffness in the neck.   His son  wants him to do push ups at home.                [facility] PT Assessment - 09/21/20 0001       Assessment   Medical Diagnosis M43.6 (ICD-10-CM) - Torticollis    Referring Provider (PT) Clelland, Delrae Rend, [place]      Posture/Postural Control   Posture/Postural Control Postural limitations    Postural Limitations Rounded Shoulders;Forward head;Increased thoracic kyphosis;Decreased lumbar lordosis                           [facility] Adult PT Treatment/Exercise - 09/21/20 0001       Neck Exercises: Seated   Neck Retraction 10 reps;3 secs    Cervical Rotation 5 reps;Left;Right    Cervical Rotation Limitations w/ thoracic 10" hold    Other Seated Exercise thoracic extension over back of chair x 15      Neck Exercises: Supine   Neck Retraction 3 secs;15 reps    Cervical Rotation Right;Left;10 reps    Shoulder Flexion Both;20 reps    Shoulder Flexion Limitations with pole 5 second hold    Other Supine Exercise Tband Bilat ER GTB 5"  x 15      Manual Therapy   Manual Therapy Joint mobilization;Manual Traction    Joint Mobilization T1-T10 PA grade III, C5-C3 lateral glides grade III    Manual Traction cervical traction      Neck Exercises: Stretches   Levator Stretch 20 seconds;2 reps;Left;Right    Corner Stretch 30 seconds;3 reps                    PT Education - 09/21/20 1101     Education Details FOTO score, push up mofications    Person(s) Educated Patient    Methods Explanation;Demonstration    Comprehension Verbalized understanding              PT Short Term Goals - 09/10/20 0907       PT SHORT TERM GOAL #1   Title The patient will be independent with a basic HEP    Baseline no HEP    Time 2    Period Weeks    Status New    Target Date 09/24/20               PT Long Term Goals - 09/10/20 0907       PT LONG TERM GOAL #1   Title The patient will present with improvement of of neck rotation motion to 50degrees bilaterally for  safety with gait.    Baseline 25 deg and 45 deg    Time 8    Period Weeks    Target Date 11/05/20      PT LONG TERM GOAL #2   Title The patient will present with improvement of neck extension AROM for looking up to 20 degrees    Baseline 5 deg    Time 8    Period Weeks    Target Date 11/05/20      PT LONG TERM GOAL #3   Title The patient will be able to reach to 120 degrees of bilateral shoulder flexion motion to decrease strain on neck musculature.    Baseline 110 degrees    Time 8    Period Weeks    Status New    Target Date 11/05/20      PT LONG TERM GOAL #4   Title The patient will have an improved FOTO score to 64% for functional activities    Baseline 50%    Time 8    Period Weeks    Target Date 11/05/20                   Plan - 09/21/20 1014     Clinical Impression Statement Timothy Harper arrived reporting that he is still not sure that he is doing the exercises correctly at home.  We reviewed them again today in therapy.  The patient continues to have severe neck stiffness and increase of thoracic kyphosis.  Progressed with sitting thoracic extension over the back of the chair.  The patient was able to perform without reports of pain increase.  Added upper back strengthening with Tband ER isometrics.  The patient did have difficulty with supine to sit tranfers and he required moderate assist in therapy.  He reported onset of low back muscle spasms post the transfer.  Recommend continued therapy for improvement of neck motion for safety with walking.    Personal Factors and Comorbidities Age;Comorbidity 1    Comorbidities Chronic Heart Failure (pacemaker)    Examination-Activity Limitations Bathing;Bed Mobility;Dressing;Hygiene/Grooming;Lift;Stand;Reach Overhead;Transfers    Examination-Participation Restrictions Cleaning;Driving;Laundry;Yard Work;Shop;Meal Prep  PT Treatment/Interventions ADLs/Self Care Home Management;Moist Heat;Electrical Stimulation;Ultrasound;Gait  training;Functional mobility training;Therapeutic activities;Therapeutic exercise;Balance training;Neuromuscular re-education;Patient/family education;Manual techniques;Joint Manipulations;Spinal Manipulations;Dry needling;Taping    PT Next Visit Plan cervical/thoracic joint mobs, pecotralis stretches, shoulder flexion stretches, pulleys, STM Neck musculature, sleeping positioning/pillows    PT Home Exercise Plan Access Code: [email or web address]    Consulted and Agree with Plan of Care Patient             Patient will benefit from skilled therapeutic intervention in order to improve the following deficits and impairments:  Abnormal gait, Decreased range of motion, Impaired UE functional use, Hypomobility, Impaired flexibility, Improper body mechanics, Decreased mobility, Postural dysfunction  Visit Diagnosis: Neck stiffness  Decreased range of motion of neck  Abnormal posture     Problem List Patient Active Problem List   Diagnosis Date Noted   Encounter for therapeutic drug monitoring 08/20/2016   CVA (cerebral vascular accident) ([place]) 08/13/2016   Chronic combined systolic and diastolic heart failure ([place]) 07/15/2016   Essential hypertension 07/09/2016   Coronary artery disease involving coronary bypass graft of native heart with angina pectoris ([place]) 12/06/2014   Atrial fibrillation ([place]) 11/02/2013   Sinus node dysfunction ([place]) 11/02/2013   Pacemaker 11/02/2013   Rich Number, PT, DPT, OCS, Crt. DN  Bethena Midget 09/21/2020, 11:03 AM  [facility] [address] [place], [place], [address] Phone: [phone]   Fax:  [phone]  Name: Timothy Harper MRN: [id number] Date of Birth: 06/05/1932

## 2020-09-26 ENCOUNTER — Ambulatory Visit: Payer: Medicare Other

## 2020-09-26 ENCOUNTER — Other Ambulatory Visit: Payer: Self-pay

## 2020-09-26 DIAGNOSIS — R293 Abnormal posture: Secondary | ICD-10-CM

## 2020-09-26 DIAGNOSIS — M436 Torticollis: Secondary | ICD-10-CM

## 2020-09-26 DIAGNOSIS — R29898 Other symptoms and signs involving the musculoskeletal system: Secondary | ICD-10-CM

## 2020-09-26 NOTE — Therapy (Signed)
[place], [place], [address] Phone: [phone]   Fax:  [phone]  Physical Therapy Treatment  Patient Details  Name: Timothy Harper MRN: [id number] Date of Birth: November 07, 1932 Referring Provider (PT): [place], [place], [place]   Encounter Date: 09/26/2020   PT End of Session - 09/26/20 1142     Visit Number 4    Number of Visits 16    Date for PT Re-Evaluation 11/05/20    Authorization Type [place] Time Period 6th visit FOTO - 10th visit progress note and FOTO    PT Start Time 1102    PT Stop Time 1147    PT Time Calculation (min) 45 min    Activity Tolerance Patient tolerated treatment well    Behavior During Therapy [facility] for tasks assessed/performed             Past Medical History:  Diagnosis Date   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 Prior PCI on RCA with restenosis   CVA (cerebral infarction) 1995   CVA (cerebral infarction)    Right   Erectile dysfunction    GERD (gastroesophageal reflux disease)    Hearing loss    hearing aids   Herpes zoster    HTN (hypertension)    Hyperlipemia    Hypothyroidism    Long term (current) use of anticoagulants    Melanoma ([place])    right ear, Rosana Hoes 2002 and left arm 1997, [place]   Primary male hypogonadism     Past Surgical History:  Procedure Laterality Date   RIGHT/LEFT HEART CATH AND CORONARY/GRAFT ANGIOGRAPHY N/A 08/26/2016   08/26/16- Patent bypass grafts    There were no vitals filed for this visit.   Subjective Assessment - 09/26/20 1106     Subjective The patient reports he has tightness in the neck with the some pain.  He rates it at a 2/10.    Currently in Pain? Yes    Pain  Score 2     Pain Location Neck                [facility] PT Assessment - 09/26/20 0001       Assessment   Medical Diagnosis M43.6 (ICD-10-CM) - Torticollis    Referring Provider (PT) [place], [place], [place]      AROM   Cervical Flexion 55    Cervical Extension 30    Cervical - Right Rotation 25    Cervical - Left Rotation 25                           [facility] Adult PT Treatment/Exercise - 09/26/20 0001       Neck Exercises: Seated   Neck Retraction 10 reps;3 secs    Cervical Rotation 5 reps;Left;Right    Cervical Rotation Limitations w/ thoracic 10" hold    Other Seated Exercise thoracic extension over back of chair x 15      Neck Exercises: Supine   Neck Retraction 3 secs;15 reps    Shoulder Flexion Both;20 reps    Shoulder Flexion Limitations with pole 5 second hold    Other Supine  Exercise Tband Bilat ER GTB 5" x 15      Manual Therapy   Manual Therapy Joint mobilization;Manual Traction    Manual Traction cervical traction      Neck Exercises: Stretches   Levator Stretch 20 seconds;2 reps;Left;Right    Warehouse manager 30 seconds;3 reps                    PT Education - 09/26/20 1147     Education Details HEP update    Person(s) Educated Patient    Methods Explanation;Handout    Comprehension Verbalized understanding              PT Short Term Goals - 09/26/20 1142       PT SHORT TERM GOAL #1   Title The patient will be independent with a basic HEP    Baseline no HEP    Time 2    Period Weeks    Status Achieved    Target Date 09/24/20               PT Long Term Goals - 09/10/20 0907       PT LONG TERM GOAL #1   Title The patient will present with improvement of of neck rotation motion to 50degrees bilaterally for safety with gait.    Baseline 25 deg and 45 deg    Time 8    Period Weeks    Target Date 11/05/20      PT LONG TERM GOAL #2   Title The patient will present with improvement of neck extension AROM  for looking up to 20 degrees    Baseline 5 deg    Time 8    Period Weeks    Target Date 11/05/20      PT LONG TERM GOAL #3   Title The patient will be able to reach to 120 degrees of bilateral shoulder flexion motion to decrease strain on neck musculature.    Baseline 110 degrees    Time 8    Period Weeks    Status New    Target Date 11/05/20      PT LONG TERM GOAL #4   Title The patient will have an improved FOTO score to 64% for functional activities    Baseline 50%    Time 8    Period Weeks    Target Date 11/05/20                   Plan - 09/26/20 1107     Clinical Impression Statement Timothy Harper presents with improvement of neck extension motion to 30 degrees today.  He continues to neck rotation limits to 25 degrees bilaterally.  Modifications were made to HEP as he was feeling the pectoralis stretch in the back versus the shoulders.  The patient needs verbal cuing in therapy for crrection performance of exercises and counting.  recommend continued therapy for improvement of neck rotation motion for driving and walking.    Personal Factors and Comorbidities Age;Comorbidity 1    Comorbidities Chronic Heart Failure (pacemaker)    Examination-Activity Limitations Bathing;Bed Mobility;Dressing;Hygiene/Grooming;Lift;Stand;Reach Overhead;Transfers    Examination-Participation Restrictions Cleaning;Driving;Laundry;Yard Work;Shop;Meal Prep    PT Treatment/Interventions ADLs/Self Care Home Management;Moist Heat;Electrical Stimulation;Ultrasound;Gait training;Functional mobility training;Therapeutic activities;Therapeutic exercise;Balance training;Neuromuscular re-education;Patient/family education;Manual techniques;Joint Manipulations;Spinal Manipulations;Dry needling;Taping    PT Next Visit Plan cervical/thoracic joint mobs, pecotralis stretches, shoulder flexion stretches, pulleys, STM Neck musculature, sleeping positioning/pillows    PT Home Exercise Plan Access Code: [facility]  Consulted and Agree with Plan of Care Patient             Patient will benefit from skilled therapeutic intervention in order to improve the following deficits and impairments:  Abnormal gait, Decreased range of motion, Impaired UE functional use, Hypomobility, Impaired flexibility, Improper body mechanics, Decreased mobility, Postural dysfunction  Visit Diagnosis: Neck stiffness  Decreased range of motion of neck  Abnormal posture     Problem List Patient Active Problem List   Diagnosis Date Noted   Encounter for therapeutic drug monitoring 08/20/2016   CVA (cerebral vascular accident) ([place]) 08/13/2016   Chronic combined systolic and diastolic heart failure ([place]) 07/15/2016   Essential hypertension 07/09/2016   Coronary artery disease involving coronary bypass graft of native heart with angina pectoris ([place]) 12/06/2014   Atrial fibrillation ([place]) 11/02/2013   Sinus node dysfunction ([place]) 11/02/2013   Pacemaker 11/02/2013   Rich Number, PT, DPT, OCS, Crt. DN  Bethena Midget 09/26/2020, 11:52 AM  [facility] [address] [place], [place], [address] Phone: [phone]   Fax:  [phone]  Name: Timothy Harper MRN: [id number] Date of Birth: 1932/05/27

## 2020-09-26 NOTE — Patient Instructions (Signed)
Access Code: [email or web address] URL: [email or web address] Date: 09/26/2020 Prepared by: Rich Number  Exercises Seated Scapular Retraction - 2 x daily - 7 x weekly - 1 sets - 10 reps - 3 hold Supine Chin Tucks on Flat Ball - 2 x daily - 7 x weekly - 1 sets - 10 reps - 3 hold Supine Cervical Rotation AROM on Pillow - 2 x daily - 7 x weekly - 1 sets - 10 reps - 2 hold Seated Levator Scapulae Stretch - 1 x daily - 7 x weekly - 1 sets - 3 reps - 20 hold Supine Shoulder Flexion with Dowel - 2 x daily - 7 x weekly - 1 sets - 15 reps - 5 hold Corner Pec Minor Stretch - 1 x daily - 7 x weekly - 1 sets - 3 reps - 20 hold Seated Thoracic Flexion and Rotation with Arms Crossed - 1 x daily - 7 x weekly - 1 sets - 5 reps - 10 hold

## 2020-09-28 ENCOUNTER — Ambulatory Visit: Payer: Medicare Other

## 2020-09-28 ENCOUNTER — Other Ambulatory Visit: Payer: Self-pay

## 2020-09-28 DIAGNOSIS — M436 Torticollis: Secondary | ICD-10-CM

## 2020-09-28 DIAGNOSIS — R29898 Other symptoms and signs involving the musculoskeletal system: Secondary | ICD-10-CM

## 2020-09-28 DIAGNOSIS — R293 Abnormal posture: Secondary | ICD-10-CM

## 2020-09-28 NOTE — Therapy (Signed)
[place], [place], [address] Phone: [phone]   Fax:  [phone]  Physical Therapy Treatment  Patient Details  Name: Timothy Harper MRN: [id number] Date of Birth: [birth year] Referring Provider (PT): [place], [place], [place]   Encounter Date: 09/28/2020   PT End of Session - 09/28/20 1020     Visit Number 5    Number of Visits 16    Date for PT Re-Evaluation 11/05/20    Authorization Type [place] Time Period 6th visit FOTO - 10th visit progress note and FOTO    PT Start Time [id number]    PT Stop Time 1056    PT Time Calculation (min) 41 min    Activity Tolerance Patient tolerated treatment well    Behavior During Therapy [facility] for tasks assessed/performed             Past Medical History:  Diagnosis Date   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 Prior PCI on RCA with restenosis   CVA (cerebral infarction) 1995   CVA (cerebral infarction)    Right   Erectile dysfunction    GERD (gastroesophageal reflux disease)    Hearing loss    hearing aids   Herpes zoster    HTN (hypertension)    Hyperlipemia    Hypothyroidism    Long term (current) use of anticoagulants    Melanoma ([place])    right ear, Rosana Hoes 2002 and left arm 1997, [place]   Primary male hypogonadism     Past Surgical History:  Procedure Laterality Date   RIGHT/LEFT HEART CATH AND CORONARY/GRAFT ANGIOGRAPHY N/A 08/26/2016   08/26/16- Patent bypass grafts    There were no vitals filed for this visit.   Subjective Assessment - 09/28/20 1058     Subjective The patient reports that he is still tight.                [facility] PT Assessment - 09/28/20 0001       Assessment    Medical Diagnosis M43.6 (ICD-10-CM) - Torticollis    Referring Provider (PT) Clelland, Delrae Rend, PA      AROM   Cervical - Right Rotation 35    Cervical - Left Rotation 25                           [facility] Adult PT Treatment/Exercise - 09/28/20 0001       Neck Exercises: Machines for Strengthening   UBE (Upper Arm Bike) 3 min Level 1      Neck Exercises: Standing   Wall Wash wall slides flexion x 10      Neck Exercises: Seated   Neck Retraction 10 reps;3 secs    Cervical Rotation 5 reps;Left;Right    Cervical Rotation Limitations w/ thoracic 10" hold    Other Seated Exercise thoracic extension over back of chair x 15      Neck Exercises: Supine   Shoulder Flexion Both;15 reps    Shoulder Flexion Weights (lbs) 3    Shoulder Flexion Limitations with pole 5 second hold    Other Supine Exercise Tband Bilat ER GTB 5" x  15      Manual Therapy   Joint Mobilization T1-T10 PA grade III, C5-C3 lateral glides grade III    Manual Traction cervical traction      Neck Exercises: Stretches   Corner Stretch 30 seconds;3 reps                      PT Short Term Goals - 09/26/20 1142       PT SHORT TERM GOAL #1   Title The patient will be independent with a basic HEP    Baseline no HEP    Time 2    Period Weeks    Status Achieved    Target Date 09/24/20               PT Long Term Goals - 09/10/20 0907       PT LONG TERM GOAL #1   Title The patient will present with improvement of of neck rotation motion to 50degrees bilaterally for safety with gait.    Baseline 25 deg and 45 deg    Time 8    Period Weeks    Target Date 11/05/20      PT LONG TERM GOAL #2   Title The patient will present with improvement of neck extension AROM for looking up to 20 degrees    Baseline 5 deg    Time 8    Period Weeks    Target Date 11/05/20      PT LONG TERM GOAL #3   Title The patient will be able to reach to 120 degrees of bilateral shoulder flexion  motion to decrease strain on neck musculature.    Baseline 110 degrees    Time 8    Period Weeks    Status New    Target Date 11/05/20      PT LONG TERM GOAL #4   Title The patient will have an improved FOTO score to 64% for functional activities    Baseline 50%    Time 8    Period Weeks    Target Date 11/05/20                   Plan - 09/28/20 1045     Clinical Impression Statement The patient continues to have neck stiffness.  His neck rotation motion is the most limited.  He had a mild increse today.  He does like to compensate with thoracic/lumbar rotation during assessment.  Inconsistent measurements due to that.  Progressed with wall slides for thoracic extension and shoulder flexion motion to improve neck range of motion.  Added the UBE.  Recommend progression of HEP next session.  Continued therapy for neck rotation motion to assist with safety with walking.    Personal Factors and Comorbidities Age;Comorbidity 1    Comorbidities Chronic Heart Failure (pacemaker)    Examination-Activity Limitations Bathing;Bed Mobility;Dressing;Hygiene/Grooming;Lift;Stand;Reach Overhead;Transfers    Examination-Participation Restrictions Cleaning;Driving;Laundry;Yard Work;Shop;Meal Prep    PT Treatment/Interventions ADLs/Self Care Home Management;Moist Heat;Electrical Stimulation;Ultrasound;Gait training;Functional mobility training;Therapeutic activities;Therapeutic exercise;Balance training;Neuromuscular re-education;Patient/family education;Manual techniques;Joint Manipulations;Spinal Manipulations;Dry needling;Taping    PT Next Visit Plan FOTO for 6th visit, Progress HEP w/ band scapular exercises and standing wall slides, cervical/thoracic joint mobs, pecotralis stretches, shoulder flexion stretches, pulleys, STM Neck musculature, sleeping positioning/pillows    PT Home Exercise Plan Access Code: [email or web address]    Consulted and Agree with Plan of Care Patient             Patient will  benefit from skilled  therapeutic intervention in order to improve the following deficits and impairments:  Abnormal gait, Decreased range of motion, Impaired UE functional use, Hypomobility, Impaired flexibility, Improper body mechanics, Decreased mobility, Postural dysfunction  Visit Diagnosis: Neck stiffness  Decreased range of motion of neck  Abnormal posture     Problem List Patient Active Problem List   Diagnosis Date Noted   Encounter for therapeutic drug monitoring 08/20/2016   CVA (cerebral vascular accident) ([place]) 08/13/2016   Chronic combined systolic and diastolic heart failure ([place]) 07/15/2016   Essential hypertension 07/09/2016   Coronary artery disease involving coronary bypass graft of native heart with angina pectoris ([place]) 12/06/2014   Atrial fibrillation ([place]) 11/02/2013   Sinus node dysfunction ([place]) 11/02/2013   Pacemaker 11/02/2013   Rich Number, PT, DPT, OCS, Crt. DN  Bethena Midget 09/28/2020, 11:02 AM  [facility] [address] [place], [place], [address] Phone: [phone]   Fax:  [phone]  Name: MERITT CERBONE MRN: [id number] Date of Birth: 1932/09/23

## 2020-10-01 ENCOUNTER — Other Ambulatory Visit: Payer: Self-pay

## 2020-10-01 ENCOUNTER — Ambulatory Visit: Payer: Medicare Other | Attending: Physician Assistant

## 2020-10-01 DIAGNOSIS — R29898 Other symptoms and signs involving the musculoskeletal system: Secondary | ICD-10-CM | POA: Diagnosis present

## 2020-10-01 DIAGNOSIS — M436 Torticollis: Secondary | ICD-10-CM | POA: Insufficient documentation

## 2020-10-01 DIAGNOSIS — R293 Abnormal posture: Secondary | ICD-10-CM | POA: Insufficient documentation

## 2020-10-01 NOTE — Patient Instructions (Signed)
Access Code: [email or web address] URL: [email or web address] Date: 10/01/2020 Prepared by: Rich Number  Exercises Supine Chin Tucks on [employer] - 2 x daily - 7 x weekly - 1 sets - 10 reps - 3 hold Supine Cervical Rotation AROM on Pillow - 2 x daily - 7 x weekly - 1 sets - 10 reps - 2 hold Seated Levator Scapulae Stretch - 1 x daily - 7 x weekly - 1 sets - 3 reps - 20 hold Supine Shoulder Flexion with [place] - 2 x daily - 7 x weekly - 1 sets - 15 reps - 5 hold Corner Pec Minor Stretch - 1 x daily - 7 x weekly - 1 sets - 3 reps - 20 hold Seated Thoracic Flexion and Rotation with Arms Crossed - 1 x daily - 7 x weekly - 1 sets - 5 reps - 10 hold Shoulder External Rotation and Scapular Retraction with Resistance - 1 x daily - 7 x weekly - 1 sets - 15 reps - 5 hold Standing Bilateral Shoulder Scaption Wall Slide - 1 x daily - 7 x weekly - 1 sets - 15 reps

## 2020-10-01 NOTE — Therapy (Signed)
[place], [place], [address] Phone: [phone]   Fax:  [phone]  Physical Therapy Treatment  Patient Details  Name: Timothy Harper MRN: [id number] Date of Birth: September 23, 1932 Referring Provider (PT): [place], [place], [place]   Encounter Date: 10/01/2020   PT End of Session - 10/01/20 1105     Visit Number 6    Number of Visits 16    Date for PT Re-Evaluation 11/05/20    Authorization Type [place] Time Period 6th visit FOTO - 10th visit progress note and FOTO    PT Start Time 1102    PT Stop Time 1145    PT Time Calculation (min) 43 min    Activity Tolerance Patient tolerated treatment well    Behavior During Therapy [facility] for tasks assessed/performed             Past Medical History:  Diagnosis Date   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 Prior PCI on RCA with restenosis   CVA (cerebral infarction) 1995   CVA (cerebral infarction)    Right   Erectile dysfunction    GERD (gastroesophageal reflux disease)    Hearing loss    hearing aids   Herpes zoster    HTN (hypertension)    Hyperlipemia    Hypothyroidism    Long term (current) use of anticoagulants    Melanoma ([place])    right ear, Rosana Hoes 2002 and left arm 1997, [place]   Primary male hypogonadism     Past Surgical History:  Procedure Laterality Date   RIGHT/LEFT HEART CATH AND CORONARY/GRAFT ANGIOGRAPHY N/A 08/26/2016   08/26/16- Patent bypass grafts    There were no vitals filed for this visit.   Subjective Assessment - 10/01/20 1237     Subjective The patient reports that he would like to see if she could swing a golf club.  His upper back has loosen up some with the  exercises.                [facility] PT Assessment - 10/01/20 0001       Assessment   Medical Diagnosis M43.6 (ICD-10-CM) - Torticollis    Referring Provider (PT) Clelland, Delrae Rend, PA      Observation/Other Assessments   Focus on Therapeutic Outcomes (FOTO)  Neck 50%      Posture/Postural Control   Posture/Postural Control Postural limitations    Postural Limitations Rounded Shoulders;Forward head;Increased thoracic kyphosis      AROM   Right Shoulder Flexion 122 Degrees    Left Shoulder Flexion 118 Degrees    Cervical Flexion 55    Cervical Extension 35    Cervical - Right Side Bend 8    Cervical - Left Side Bend 4    Cervical - Right Rotation 35    Cervical - Left Rotation 30                           [facility] Adult PT Treatment/Exercise - 10/01/20 0001       Neck Exercises: Machines for Strengthening   UBE (Upper Arm Bike) 3 min Level 1  Neck Exercises: Theraband   Shoulder External Rotation 15 reps;Green    Shoulder External Rotation Limitations seated      Neck Exercises: Seated   Cervical Rotation 5 reps;Left;Right    Cervical Rotation Limitations w/ thoracic 10" hold    Other Seated Exercise thoracic extension over back of chair x 10      Neck Exercises: Supine   Shoulder Flexion Both;15 reps    Shoulder Flexion Weights (lbs) 5    Shoulder Flexion Limitations with pole 5 second hold      Neck Exercises: Stretches   Corner Stretch 30 seconds;3 reps      Manual Therapy   Manual Traction cervical traction                      PT Short Term Goals - 09/26/20 1142       PT SHORT TERM GOAL #1   Title The patient will be independent with a basic HEP    Baseline no HEP    Time 2    Period Weeks    Status Achieved    Target Date 09/24/20               PT Long Term Goals - 10/01/20 1143       PT LONG TERM GOAL #1   Title The patient will present with improvement of of neck rotation motion to 45 degrees  bilaterally for safety with gait.    Time 8    Period Weeks    Status Partially Met      PT LONG TERM GOAL #2   Title The patient will present with improvement of neck extension AROM for looking up to 20 degrees    Baseline 5 deg    Time 8    Period Weeks    Status Achieved      PT LONG TERM GOAL #3   Title The patient will be able to reach to 120 degrees of bilateral shoulder flexion motion to decrease strain on neck musculature.    Baseline 110 degrees    Time 8    Period Weeks    Status Partially Met      PT LONG TERM GOAL #4   Title The patient will have an improved FOTO score to 64% for functional activities    Baseline 50%    Time 8    Period Weeks    Status On-going                   Plan - 10/01/20 1106     Clinical Impression Statement The patient's FOTO remained at 50% for the neck.  He does have some improvement of neck rotation motion.  He has maintained the gains made in his neck flexion and extension range of motion.  Rane had improvement of bilateral shoulder active flexion motion from 110 degrees to 118 to 120 degrees.   The patient still has difficulty with turning his head to the side. Recommend continued therapy for neck rotation motion for safety with driving and walking to cross the street.    Personal Factors and Comorbidities Age;Comorbidity 1    Comorbidities Chronic Heart Failure (pacemaker)    Examination-Activity Limitations Bathing;Bed Mobility;Dressing;Hygiene/Grooming;Lift;Stand;Reach Overhead;Transfers    PT Treatment/Interventions ADLs/Self Care Home Management;Moist Heat;Electrical Stimulation;Ultrasound;Gait training;Functional mobility training;Therapeutic activities;Therapeutic exercise;Balance training;Neuromuscular re-education;Patient/family education;Manual techniques;Joint Manipulations;Spinal Manipulations;Dry needling;Taping    PT Next Visit Plan cervical/thoracic joint mobs, pecotralis stretches, shoulder flexion stretches,  pulleys, STM Neck musculature, sleeping  positioning/pillows    PT Home Exercise Plan Access Code: [email or web address]    Consulted and Agree with Plan of Care Patient             Patient will benefit from skilled therapeutic intervention in order to improve the following deficits and impairments:  Abnormal gait, Decreased range of motion, Impaired UE functional use, Hypomobility, Impaired flexibility, Improper body mechanics, Decreased mobility, Postural dysfunction  Visit Diagnosis: Decreased range of motion of neck  Neck stiffness  Abnormal posture     Problem List Patient Active Problem List   Diagnosis Date Noted   Encounter for therapeutic drug monitoring 08/20/2016   CVA (cerebral vascular accident) ([place]) 08/13/2016   Chronic combined systolic and diastolic heart failure ([place]) 07/15/2016   Essential hypertension 07/09/2016   Coronary artery disease involving coronary bypass graft of native heart with angina pectoris ([place]) 12/06/2014   Atrial fibrillation ([place]) 11/02/2013   Sinus node dysfunction ([place]) 11/02/2013   Pacemaker 11/02/2013   Rich Number, PT, DPT, OCS, Crt. DN Bethena Midget 10/01/2020, 12:40 PM  [facility] [address] [place], [place], [address] Phone: [phone]   Fax:  [phone]  Name: Timothy Harper MRN: [id number] Date of Birth: 06-15-1932

## 2020-10-04 ENCOUNTER — Ambulatory Visit: Payer: Medicare Other

## 2020-10-04 ENCOUNTER — Other Ambulatory Visit: Payer: Self-pay

## 2020-10-04 DIAGNOSIS — M436 Torticollis: Secondary | ICD-10-CM

## 2020-10-04 DIAGNOSIS — R29898 Other symptoms and signs involving the musculoskeletal system: Secondary | ICD-10-CM | POA: Diagnosis not present

## 2020-10-04 DIAGNOSIS — R293 Abnormal posture: Secondary | ICD-10-CM

## 2020-10-04 NOTE — Therapy (Signed)
[place], [place], [address] Phone: [phone]   Fax:  [phone]  Physical Therapy Treatment  Patient Details  Name: Timothy Harper MRN: [id number] Date of Birth: 1932/07/11 Referring Provider (PT): [place], [place], [place]   Encounter Date: 10/04/2020   PT End of Session - 10/04/20 1109     Visit Number 7    Number of Visits 16    Date for PT Re-Evaluation 11/05/20    Authorization Type [place] Time Period 6th visit FOTO - 10th visit progress note and FOTO    Progress Note Due on Visit 10    PT Start Time 1101    PT Stop Time 1142    PT Time Calculation (min) 41 min    Activity Tolerance Patient tolerated treatment well    Behavior During Therapy [facility] for tasks assessed/performed             Past Medical History:  Diagnosis Date   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 Prior PCI on RCA with restenosis   CVA (cerebral infarction) 1995   CVA (cerebral infarction)    Right   Erectile dysfunction    GERD (gastroesophageal reflux disease)    Hearing loss    hearing aids   Herpes zoster    HTN (hypertension)    Hyperlipemia    Hypothyroidism    Long term (current) use of anticoagulants    Melanoma ([place])    right ear, Rosana Hoes 2002 and left arm 1997, [place]   Primary male hypogonadism     Past Surgical History:  Procedure Laterality Date   RIGHT/LEFT HEART CATH AND CORONARY/GRAFT ANGIOGRAPHY N/A 08/26/2016   08/26/16- Patent bypass grafts    There were no vitals filed for this visit.   Subjective Assessment - 10/04/20 1113     Subjective The patient reports that he has soreness after doing the exercises at home.    Currently in  Pain? No/denies                [facility] PT Assessment - 10/04/20 0001       Assessment   Medical Diagnosis M43.6 (ICD-10-CM) - Torticollis    Referring Provider (PT) Clelland, Delrae Rend, PA      AROM   Cervical - Right Rotation 35    Cervical - Left Rotation 30                           [facility] Adult PT Treatment/Exercise - 10/04/20 0001       Neck Exercises: Machines for Strengthening   UBE (Upper Arm Bike) 3 min Level 1      Neck Exercises: Theraband   Shoulder Extension Green;15 reps    Rows 20 reps    Rows Limitations machine 20#    Shoulder External Rotation 15 reps;Green    Shoulder External Rotation Limitations seated      Neck Exercises: Standing   Wall Wash wall slides flexion x 10      Neck Exercises: Seated   Neck Retraction 10 reps;3 secs    Cervical Rotation 5 reps;Left;Right    Cervical Rotation  Limitations w/ thoracic 10" hold    Other Seated Exercise thoracic extension over back of chair x 10      Manual Therapy   Joint Mobilization T1-T10 PA grade III, C5-C3 lateral glides grade III                      PT Short Term Goals - 09/26/20 1142       PT SHORT TERM GOAL #1   Title The patient will be independent with a basic HEP    Baseline no HEP    Time 2    Period Weeks    Status Achieved    Target Date 09/24/20               PT Long Term Goals - 10/01/20 1143       PT LONG TERM GOAL #1   Title The patient will present with improvement of of neck rotation motion to 45 degrees bilaterally for safety with gait.    Time 8    Period Weeks    Status Partially Met      PT LONG TERM GOAL #2   Title The patient will present with improvement of neck extension AROM for looking up to 20 degrees    Baseline 5 deg    Time 8    Period Weeks    Status Achieved      PT LONG TERM GOAL #3   Title The patient will be able to reach to 120 degrees of bilateral shoulder flexion motion to decrease strain on neck  musculature.    Baseline 110 degrees    Time 8    Period Weeks    Status Partially Met      PT LONG TERM GOAL #4   Title The patient will have an improved FOTO score to 64% for functional activities    Baseline 50%    Time 8    Period Weeks    Status On-going                   Plan - 10/04/20 1120     Clinical Impression Statement The patient continues to have improvement of neck rotation motion to 30 and 35 degrees.  He did seem to have improvement of upright posturing in therapy.  Progressed scapular and upper back strengthening with rows and Tband shoulder extension.  The patient tolerated those well.  Added sitting pectoralis stretch/thoracic extension to his HEP as he wanted to add that one on.  The patient is doing well in therapy.  He did need extra cuing for sitting chin tucks today.    Personal Factors and Comorbidities Age;Comorbidity 1    Comorbidities Chronic Heart Failure (pacemaker)    Examination-Activity Limitations Bathing;Bed Mobility;Dressing;Hygiene/Grooming;Lift;Stand;Reach Overhead;Transfers    Examination-Participation Restrictions Cleaning;Driving;Laundry;Yard Work;Shop;Meal Prep    PT Treatment/Interventions ADLs/Self Care Home Management;Moist Heat;Electrical Stimulation;Ultrasound;Gait training;Functional mobility training;Therapeutic activities;Therapeutic exercise;Balance training;Neuromuscular re-education;Patient/family education;Manual techniques;Joint Manipulations;Spinal Manipulations;Dry needling;Taping    PT Next Visit Plan cervical/thoracic joint mobs, pecotralis stretches, shoulder flexion stretches, pulleys, STM Neck musculature, sleeping positioning/pillows    PT Home Exercise Plan Access Code: [email or web address]    Consulted and Agree with Plan of Care Patient             Patient will benefit from skilled therapeutic intervention in order to improve the following deficits and impairments:  Abnormal gait, Decreased range of motion, Impaired UE  functional use, Hypomobility, Impaired flexibility, Improper body mechanics, Decreased mobility, Postural dysfunction  Visit Diagnosis: Decreased range of motion of neck  Neck stiffness  Abnormal posture     Problem List Patient Active Problem List   Diagnosis Date Noted   Encounter for therapeutic drug monitoring 08/20/2016   CVA (cerebral vascular accident) ([place]) 08/13/2016   Chronic combined systolic and diastolic heart failure ([place]) 07/15/2016   Essential hypertension 07/09/2016   Coronary artery disease involving coronary bypass graft of native heart with angina pectoris ([place]) 12/06/2014   Atrial fibrillation ([place]) 11/02/2013   Sinus node dysfunction ([place]) 11/02/2013   Pacemaker 11/02/2013   Rich Number, PT, DPT, OCS, Crt. DN  Bethena Midget 10/04/2020, 11:44 AM  [facility] [address] [place], [place], [address] Phone: [phone]   Fax:  [phone]  Name: Timothy Harper MRN: [id number] Date of Birth: Jun 27, 1932

## 2020-10-08 ENCOUNTER — Ambulatory Visit: Payer: Medicare Other

## 2020-10-08 ENCOUNTER — Telehealth: Payer: Self-pay

## 2020-10-08 NOTE — Telephone Encounter (Signed)
Timothy Harper did not show to his appointment today.  I called him and he said that he had forgotten.  He did want to come in later today, but there was not openings.  He was re-scheduled to 8/16 at 10:15 am.  Rich Number, PT, DPT, OCS, Crt. DN

## 2020-10-11 ENCOUNTER — Ambulatory Visit: Payer: Medicare Other

## 2020-10-11 ENCOUNTER — Other Ambulatory Visit: Payer: Self-pay

## 2020-10-11 DIAGNOSIS — R29898 Other symptoms and signs involving the musculoskeletal system: Secondary | ICD-10-CM | POA: Diagnosis not present

## 2020-10-11 DIAGNOSIS — R293 Abnormal posture: Secondary | ICD-10-CM

## 2020-10-11 DIAGNOSIS — M436 Torticollis: Secondary | ICD-10-CM

## 2020-10-11 NOTE — Therapy (Signed)
[place], [place], [address] Phone: [phone]   Fax:  [phone]  Physical Therapy Treatment  Patient Details  Name: Timothy Harper MRN: [id number] Date of Birth: Oct 09, 1932 Referring Provider (PT): [place], [place], [place]   Encounter Date: 10/11/2020   PT End of Session - 10/11/20 1151     Visit Number 8    Number of Visits 16    Date for PT Re-Evaluation 11/05/20    Authorization Type [place] Time Period 6th visit FOTO - 10th visit progress note and FOTO    PT Start Time 1105    PT Stop Time 1147    PT Time Calculation (min) 42 min    Activity Tolerance Patient tolerated treatment well    Behavior During Therapy [facility] for tasks assessed/performed             Past Medical History:  Diagnosis Date   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 Prior PCI on RCA with restenosis   CVA (cerebral infarction) 1995   CVA (cerebral infarction)    Right   Erectile dysfunction    GERD (gastroesophageal reflux disease)    Hearing loss    hearing aids   Herpes zoster    HTN (hypertension)    Hyperlipemia    Hypothyroidism    Long term (current) use of anticoagulants    Melanoma ([place])    right ear, Rosana Hoes 2002 and left arm 1997, [place]   Primary male hypogonadism     Past Surgical History:  Procedure Laterality Date   RIGHT/LEFT HEART CATH AND CORONARY/GRAFT ANGIOGRAPHY N/A 08/26/2016   08/26/16- Patent bypass grafts    There were no vitals filed for this visit.   Subjective Assessment - 10/11/20 1120     Subjective The patient reports that he is having troubles with one of the exercises at home.    Patient Stated Goals 8                                [facility] Adult PT Treatment/Exercise - 10/11/20 0001       Neck Exercises: Theraband   Shoulder External Rotation 15 reps;Green    Shoulder External Rotation Limitations seated      Neck Exercises: Standing   Wall Wash wall slides flexion x 10      Neck Exercises: Seated   Cervical Rotation 5 reps;Left;Right    Cervical Rotation Limitations w/ thoracic 10" hold    Other Seated Exercise thoracic extension over back of chair x 10      Neck Exercises: Supine   Neck Retraction 10 reps;3 secs    Cervical Rotation 10 reps;Left;Right    Shoulder Flexion Both;15 reps    Shoulder Flexion Weights (lbs) 5    Shoulder Flexion Limitations with pole 5 second hold      Neck Exercises: Stretches   Corner Stretch 30 seconds;3 reps      Manual Therapy   Joint Mobilization T1-T10 PA grade III, C5-C3 lateral glides grade III  PT Education - 10/11/20 1152     Education Details Review current HEP    Person(s) Educated Patient    Methods Explanation;Handout;Demonstration    Comprehension Verbalized understanding              PT Short Term Goals - 09/26/20 1142       PT SHORT TERM GOAL #1   Title The patient will be independent with a basic HEP    Baseline no HEP    Time 2    Period Weeks    Status Achieved    Target Date 09/24/20               PT Long Term Goals - 10/01/20 1143       PT LONG TERM GOAL #1   Title The patient will present with improvement of of neck rotation motion to 45 degrees bilaterally for safety with gait.    Time 8    Period Weeks    Status Partially Met      PT LONG TERM GOAL #2   Title The patient will present with improvement of neck extension AROM for looking up to 20 degrees    Baseline 5 deg    Time 8    Period Weeks    Status Achieved      PT LONG TERM GOAL #3   Title The patient will be able to reach to 120 degrees of bilateral shoulder flexion motion to decrease  strain on neck musculature.    Baseline 110 degrees    Time 8    Period Weeks    Status Partially Met      PT LONG TERM GOAL #4   Title The patient will have an improved FOTO score to 64% for functional activities    Baseline 50%    Time 8    Period Weeks    Status On-going                   Plan - 10/11/20 1108     Clinical Impression Statement Timothy Harper did need to review his home exercises today.  He did have some confusion on the instructions.  Some of the instructions were re-worded today to increse the patient's understanding and improve correct performance.  Recommend continued therapy to again review the home exercises to reduce any confusion that the patient may have.  Timothy Harper continues to have increase of upright posturing when arriving to therapy.    Personal Factors and Comorbidities Age;Comorbidity 1    Comorbidities Chronic Heart Failure (pacemaker)    Examination-Activity Limitations Bathing;Bed Mobility;Dressing;Hygiene/Grooming;Lift;Stand;Reach Overhead;Transfers    Examination-Participation Restrictions Cleaning;Driving;Laundry;Yard Work;Shop;Meal Prep    PT Treatment/Interventions ADLs/Self Care Home Management;Moist Heat;Electrical Stimulation;Ultrasound;Gait training;Functional mobility training;Therapeutic activities;Therapeutic exercise;Balance training;Neuromuscular re-education;Patient/family education;Manual techniques;Joint Manipulations;Spinal Manipulations;Dry needling;Taping    PT Next Visit Plan cervical/thoracic joint mobs, pecotralis stretches, shoulder flexion stretches, pulleys, STM Neck musculature, sleeping positioning/pillows    PT Home Exercise Plan Access Code: [email or web address]    Consulted and Agree with Plan of Care Patient             Patient will benefit from skilled therapeutic intervention in order to improve the following deficits and impairments:  Abnormal gait, Decreased range of motion, Impaired UE functional use, Hypomobility, Impaired  flexibility, Improper body mechanics, Decreased mobility, Postural dysfunction  Visit Diagnosis: Decreased range of motion of neck  Neck stiffness  Abnormal posture     Problem List Patient Active Problem List   Diagnosis Date Noted  Encounter for therapeutic drug monitoring 08/20/2016   CVA (cerebral vascular accident) ([place]) 08/13/2016   Chronic combined systolic and diastolic heart failure ([place]) 07/15/2016   Essential hypertension 07/09/2016   Coronary artery disease involving coronary bypass graft of native heart with angina pectoris ([place]) 12/06/2014   Atrial fibrillation ([place]) 11/02/2013   Sinus node dysfunction ([place]) 11/02/2013   Pacemaker 11/02/2013   Rich Number, PT, DPT, OCS, Crt. DN  Bethena Midget 10/11/2020, 11:55 AM  [facility] [address] [place], [place], [address] Phone: [phone]   Fax:  [phone]  Name: JAMONI HEWES MRN: [id number] Date of Birth: 09-Dec-1932

## 2020-10-12 ENCOUNTER — Other Ambulatory Visit: Payer: Self-pay | Admitting: Interventional Cardiology

## 2020-10-12 DIAGNOSIS — I5042 Chronic combined systolic (congestive) and diastolic (congestive) heart failure: Secondary | ICD-10-CM

## 2020-10-16 ENCOUNTER — Ambulatory Visit: Payer: Medicare Other

## 2020-10-16 ENCOUNTER — Other Ambulatory Visit: Payer: Self-pay

## 2020-10-16 DIAGNOSIS — M436 Torticollis: Secondary | ICD-10-CM

## 2020-10-16 DIAGNOSIS — R29898 Other symptoms and signs involving the musculoskeletal system: Secondary | ICD-10-CM | POA: Diagnosis not present

## 2020-10-16 DIAGNOSIS — R293 Abnormal posture: Secondary | ICD-10-CM

## 2020-10-16 NOTE — Therapy (Signed)
[place], [place], [address] Phone: [phone]   Fax:  [phone]  Physical Therapy Treatment  Patient Details  Name: Timothy Harper MRN: [id number] Date of Birth: 07/09/1932 Referring Provider (PT): [place], [place], [place]   Encounter Date: 10/16/2020   PT End of Session - 10/16/20 1028     Visit Number 9    Number of Visits 16    Date for PT Re-Evaluation 11/05/20    Authorization Type [place] Time Period 6th visit FOTO - 10th visit progress note and FOTO    PT Start Time 1017    PT Stop Time 1056    PT Time Calculation (min) 39 min    Activity Tolerance Patient tolerated treatment well    Behavior During Therapy WFL for tasks assessed/performed             Past Medical History:  Diagnosis Date   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 Prior PCI on RCA with restenosis   CVA (cerebral infarction) 1995   CVA (cerebral infarction)    Right   Erectile dysfunction    GERD (gastroesophageal reflux disease)    Hearing loss    hearing aids   Herpes zoster    HTN (hypertension)    Hyperlipemia    Hypothyroidism    Long term (current) use of anticoagulants    Melanoma ([place])    right ear, Rosana Hoes 2002 and left arm 1997, [place]   Primary male hypogonadism     Past Surgical History:  Procedure Laterality Date   RIGHT/LEFT HEART CATH AND CORONARY/GRAFT ANGIOGRAPHY N/A 08/26/2016   08/26/16- Patent bypass grafts    There were no vitals filed for this visit.   Subjective Assessment - 10/16/20 1032     Subjective The patient reports that his hearing aid is not working, so he is having difficulty with hearing today.    Patient Stated Goals  To get exercises and stretches to do.                [facility] PT Assessment - 10/16/20 0001       Assessment   Medical Diagnosis M43.6 (ICD-10-CM) - Torticollis    Referring Provider (PT) [place], Delrae Rend, PA      Posture/Postural Control   Posture/Postural Control Postural limitations    Postural Limitations Rounded Shoulders;Forward head;Increased thoracic kyphosis                           [facility] Adult PT Treatment/Exercise - 10/16/20 0001       Neck Exercises: Machines for Strengthening   UBE (Upper Arm Bike) 3 min Level 1      Neck Exercises: Theraband   Shoulder Extension Green;15 reps    Rows 20 reps    Rows Limitations machine 20#    Shoulder External Rotation 15 reps;Green    Shoulder External Rotation Limitations seated    Horizontal ABduction 15 reps;Red    Horizontal ABduction Limitations Supine      Neck Exercises: Standing   Wall Wash wall slides flexion x 10      Neck Exercises: Seated  Neck Retraction 10 reps;3 secs    Cervical Rotation 5 reps;Left;Right    Cervical Rotation Limitations w/ thoracic 10" hold    Other Seated Exercise thoracic extension over back of chair x 10      Neck Exercises: Supine   Cervical Rotation 10 reps;Left;Right    Shoulder Flexion Both;15 reps    Shoulder Flexion Weights (lbs) 5    Shoulder Flexion Limitations with pole 5 second hold                      PT Short Term Goals - 09/26/20 1142       PT SHORT TERM GOAL #1   Title The patient will be independent with a basic HEP    Baseline no HEP    Time 2    Period Weeks    Status Achieved    Target Date 09/24/20               PT Long Term Goals - 10/01/20 1143       PT LONG TERM GOAL #1   Title The patient will present with improvement of of neck rotation motion to 45 degrees bilaterally for safety with gait.    Time 8    Period Weeks    Status Partially Met      PT LONG TERM GOAL #2   Title The patient will present  with improvement of neck extension AROM for looking up to 20 degrees    Baseline 5 deg    Time 8    Period Weeks    Status Achieved      PT LONG TERM GOAL #3   Title The patient will be able to reach to 120 degrees of bilateral shoulder flexion motion to decrease strain on neck musculature.    Baseline 110 degrees    Time 8    Period Weeks    Status Partially Met      PT LONG TERM GOAL #4   Title The patient will have an improved FOTO score to 64% for functional activities    Baseline 50%    Time 8    Period Weeks    Status On-going                   Plan - 10/16/20 [name] continues to have increase of the thoracic kyphosis, but it is improving.  The patient is compliant with his exercises at home.  He did need a reveiw again today.  Progressed wihth Supine Tband horizontal abduction.  The patient's HEp will not be progressed as the patient tends to get confused with new exercises.  Recommend re-assessment next session for likely DC to HEP.    Personal Factors and Comorbidities Age;Comorbidity 1    Comorbidities Chronic Heart Failure (pacemaker)    Examination-Activity Limitations Bathing;Bed Mobility;Dressing;Hygiene/Grooming;Lift;Stand;Reach Overhead;Transfers    Examination-Participation Restrictions Cleaning;Driving;Laundry;Yard Work;Shop;Meal Prep    PT Treatment/Interventions ADLs/Self Care Home Management;Moist Heat;Electrical Stimulation;Ultrasound;Gait training;Functional mobility training;Therapeutic activities;Therapeutic exercise;Balance training;Neuromuscular re-education;Patient/family education;Manual techniques;Joint Manipulations;Spinal Manipulations;Dry needling;Taping    PT Next Visit Plan Progress note for possible DC to HEP, cervical/thoracic joint mobs, pecotralis stretches, shoulder flexion stretches, pulleys, STM Neck musculature, sleeping positioning/pillows    PT Home Exercise Plan Access Code: [email or web address]     Consulted and Agree with Plan of Care Patient             Patient will benefit from skilled therapeutic intervention in order to  improve the following deficits and impairments:  Abnormal gait, Decreased range of motion, Impaired UE functional use, Hypomobility, Impaired flexibility, Improper body mechanics, Decreased mobility, Postural dysfunction  Visit Diagnosis: Decreased range of motion of neck  Neck stiffness  Abnormal posture     Problem List Patient Active Problem List   Diagnosis Date Noted   Encounter for therapeutic drug monitoring 08/20/2016   CVA (cerebral vascular accident) ([place]) 08/13/2016   Chronic combined systolic and diastolic heart failure ([place]) 07/15/2016   Essential hypertension 07/09/2016   Coronary artery disease involving coronary bypass graft of native heart with angina pectoris ([place]) 12/06/2014   Atrial fibrillation ([place]) 11/02/2013   Sinus node dysfunction ([place]) 11/02/2013   Pacemaker 11/02/2013   Rich Number, PT, DPT, OCS, Crt. DN  Bethena Midget 10/16/2020, 11:00 AM  [facility] [address] [place], [place], [address] Phone: [phone]   Fax:  [phone]  Name: Timothy Harper MRN: [id number] Date of Birth: 04-18-1932

## 2020-10-18 ENCOUNTER — Ambulatory Visit: Payer: Medicare Other

## 2020-10-18 ENCOUNTER — Other Ambulatory Visit: Payer: Self-pay

## 2020-10-18 DIAGNOSIS — M436 Torticollis: Secondary | ICD-10-CM

## 2020-10-18 DIAGNOSIS — R29898 Other symptoms and signs involving the musculoskeletal system: Secondary | ICD-10-CM

## 2020-10-18 DIAGNOSIS — R293 Abnormal posture: Secondary | ICD-10-CM

## 2020-10-18 NOTE — Therapy (Signed)
[place], [place], [address] Phone: [phone]   Fax:  [phone]  Physical Therapy Treatment / Discharge  Patient Details  Name: Timothy Harper MRN: [id number] Date of Birth: May 01, 1932 Referring Provider (PT): [place], [place], [place]  PHYSICAL THERAPY DISCHARGE SUMMARY  Visits from Start of Care: 09/10/20  Current functional level related to goals / functional outcomes: FOTO 56%   Remaining deficits: Abnormal posture, cervical/thoracic hypomobility, limited cervical rotation AROM   Education / Equipment: HEP   Patient agrees to discharge. Patient goals were partially met. Patient is being discharged due to maximized rehab potential.    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 Prior PCI on RCA with restenosis   CVA (cerebral infarction) 1995   CVA (cerebral infarction)    Right   Erectile dysfunction    GERD (gastroesophageal reflux disease)    Hearing loss    hearing aids   Herpes zoster    HTN (hypertension)    Hyperlipemia    Hypothyroidism    Long term (current) use of anticoagulants    Melanoma ([place])    right ear, Rosana Hoes 2002 and left arm 1997, [place]   Primary male hypogonadism     Past Surgical History:  Procedure Laterality Date   RIGHT/LEFT HEART CATH AND CORONARY/GRAFT ANGIOGRAPHY N/A 08/26/2016   08/26/16- Patent bypass grafts    There were no vitals filed for this visit.       [facility] PT Assessment - 10/18/20 0001       Assessment   Medical Diagnosis M43.6 (ICD-10-CM) - Torticollis    Referring Provider (PT) [place], Delrae Rend, [place]      Precautions   Precautions ICD/Pacemaker      Observation/Other Assessments   Focus on Therapeutic Outcomes (FOTO)  Neck 56%      Posture/Postural Control   Posture/Postural Control Postural limitations    Postural Limitations Rounded Shoulders;Forward head;Increased thoracic kyphosis;Decreased lumbar lordosis      AROM   Right Shoulder Flexion 135 Degrees    Left Shoulder Flexion 120 Degrees    Cervical Flexion 55    Cervical Extension 35    Cervical - Right Side Bend 8    Cervical - Left Side Bend 8    Cervical - Right Rotation 25    Cervical - Left Rotation 25      Bed Mobility   Supine to Sit Minimal Assistance - Patient > 75%  [place] Adult PT Treatment/Exercise - 10/18/20 0001       Neck Exercises: Machines for Strengthening   UBE (Upper Arm Bike) 3 min Level 1      Neck Exercises: Theraband   Shoulder External Rotation 15 reps;Green    Shoulder External Rotation Limitations seated      Neck Exercises: Standing   Wall Wash wall slides flexion x 10      Neck Exercises: Seated   Cervical Rotation 5 reps;Left;Right    Cervical Rotation Limitations w/ thoracic 10" hold    Other Seated Exercise thoracic extension over back of chair x 10      Neck Exercises: Supine   Neck Retraction 10 reps;3 secs    Cervical Rotation 10 reps;Left;Right    Shoulder Flexion Both;15 reps    Shoulder Flexion Weights (lbs) 5    Shoulder Flexion Limitations with pole 5 second hold      Neck Exercises: Stretches   Corner Stretch 30  seconds;3 reps      Manual Therapy   Joint Mobilization T1-T10 PA grade III, C5-C3 lateral glides grade III                    PT Education - 10/18/20 1225     Education Details Continuation of current HEP for continued management of neck stiffness    Person(s) Educated Patient    Methods Explanation    Comprehension Verbalized understanding              PT Short Term Goals - 09/26/20 1142       PT SHORT TERM GOAL #1   Title The patient will be independent with a basic HEP    Baseline no HEP    Time 2    Period Weeks    Status Achieved    Target Date 09/24/20               PT Long Term Goals - 10/18/20 1201       PT LONG TERM GOAL #1   Title The patient will present with improvement of of neck rotation motion to 45 degrees bilaterally for safety with gait.    Time 8    Period Weeks    Status Partially Met      PT LONG TERM GOAL #2   Title The patient will present with improvement of neck extension AROM for looking up to 20 degrees    Baseline 5 deg    Time 8    Period Weeks    Status Achieved      PT LONG TERM GOAL #3   Title The patient will be able to reach to 120 degrees of bilateral shoulder flexion motion to decrease strain on neck musculature.    Baseline 110 degrees    Time 8    Period Weeks    Status Achieved      PT LONG TERM GOAL #4   Title The patient will have an improved FOTO score to 64% for functional activities    Baseline 50% 8/18: 56%    Time 8    Period Weeks    Status Partially Met                   Plan - 10/18/20 1156     Clinical Impression Statement Timothy Harper has attended 10 sessions of physical therapy for neck stiffness and abnormal posture.  The patient has made improvement of his posture with less of a rounded shoulder posture  in rest, but he continues to have a forward head and increase of thoracic kyphosis.  The patient continues to have limited neck rotation motion and cervical hypomobility.  He has  made minimal progress with neck rotation motion in the last 10 sessions.  His cervical extension motion did improve to 35 degrees from only 5 degrees.  The patient has also make improvement of bilateral shoulder flexion active motion to 120 to 130 degrees.  The patient has a well established home exercise program to maintain his gains in therapy.  Recommend discharge to his home exercise program.    Personal Factors and Comorbidities Age;Comorbidity 1    Comorbidities Chronic Heart Failure (pacemaker)    Examination-Activity Limitations Bed Mobility;Transfers    Examination-Participation Restrictions Cleaning;Driving;Laundry;Yard Work;Shop;Meal Prep    PT Treatment/Interventions ADLs/Self Care Home Management;Moist Heat;Electrical Stimulation;Ultrasound;Gait training;Functional mobility training;Therapeutic activities;Therapeutic exercise;Balance training;Neuromuscular re-education;Patient/family education;Manual techniques;Joint Manipulations;Spinal Manipulations;Dry needling;Taping    PT Next Visit Plan discharge to HEP    PT Home Exercise Plan Access Code: [email or web address]    Consulted and Agree with Plan of Care Patient             Patient will benefit from skilled therapeutic intervention in order to improve the following deficits and impairments:  Decreased range of motion, Impaired UE functional use, Hypomobility, Impaired flexibility, Improper body mechanics, Decreased mobility, Postural dysfunction  Visit Diagnosis: Decreased range of motion of neck  Neck stiffness  Abnormal posture     Problem List Patient Active Problem List   Diagnosis Date Noted   Encounter for therapeutic drug monitoring 08/20/2016   CVA (cerebral vascular accident) ([place]) 08/13/2016   Chronic combined systolic and diastolic heart failure ([place]) 07/15/2016   Essential hypertension 07/09/2016   Coronary artery disease involving coronary bypass graft of native heart with angina pectoris ([place]) 12/06/2014   Atrial  fibrillation ([place]) 11/02/2013   Sinus node dysfunction ([place]) 11/02/2013   Pacemaker 11/02/2013   Rich Number, PT, DPT, OCS, Crt. DN Bethena Midget 10/18/2020, 12:36 PM  [facility] [address] [place], [place], [address] Phone: [phone]   Fax:  [phone]  Name: Timothy Harper MRN: [id number] Date of Birth: [birth year]

## 2020-10-23 ENCOUNTER — Ambulatory Visit (INDEPENDENT_AMBULATORY_CARE_PROVIDER_SITE_OTHER): Payer: Medicare Other

## 2020-10-23 DIAGNOSIS — I5042 Chronic combined systolic (congestive) and diastolic (congestive) heart failure: Secondary | ICD-10-CM | POA: Diagnosis not present

## 2020-10-25 LAB — CUP PACEART REMOTE DEVICE CHECK
Battery Impedance: 2567 Ohm
Battery Remaining Longevity: 35 mo
Battery Voltage: 2.73 V
Brady Statistic RV Percent Paced: 18 %
Date Time Interrogation Session: 20220825114713
Implantable Lead Implant Date: 20090506
Implantable Lead Implant Date: 20090506
Implantable Lead Location: 753859
Implantable Lead Location: 753860
Implantable Lead Model: 4024
Implantable Pulse Generator Implant Date: 20090506
Lead Channel Impedance Value: 586 Ohm
Lead Channel Impedance Value: 67 Ohm
Lead Channel Pacing Threshold Amplitude: 0.875 V
Lead Channel Pacing Threshold Pulse Width: 0.4 ms
Lead Channel Setting Pacing Amplitude: 2.5 V
Lead Channel Setting Pacing Pulse Width: 0.4 ms
Lead Channel Setting Sensing Sensitivity: 2 mV

## 2020-11-07 NOTE — Progress Notes (Signed)
Remote pacemaker transmission.   

## 2020-11-13 ENCOUNTER — Other Ambulatory Visit: Payer: Self-pay | Admitting: Interventional Cardiology

## 2021-02-05 ENCOUNTER — Ambulatory Visit (INDEPENDENT_AMBULATORY_CARE_PROVIDER_SITE_OTHER): Payer: Medicare Other

## 2021-02-05 DIAGNOSIS — I495 Sick sinus syndrome: Secondary | ICD-10-CM | POA: Diagnosis not present

## 2021-02-05 LAB — CUP PACEART REMOTE DEVICE CHECK
Battery Impedance: 2766 Ohm
Battery Remaining Longevity: 33 mo
Battery Voltage: 2.72 V
Brady Statistic RV Percent Paced: 21 %
Date Time Interrogation Session: 20221205164229
Implantable Lead Implant Date: 20090506
Implantable Lead Implant Date: 20090506
Implantable Lead Location: 753859
Implantable Lead Location: 753860
Implantable Lead Model: 4024
Implantable Pulse Generator Implant Date: 20090506
Lead Channel Impedance Value: 580 Ohm
Lead Channel Impedance Value: 67 Ohm
Lead Channel Pacing Threshold Amplitude: 1 V
Lead Channel Pacing Threshold Pulse Width: 0.4 ms
Lead Channel Setting Pacing Amplitude: 2.5 V
Lead Channel Setting Pacing Pulse Width: 0.4 ms
Lead Channel Setting Sensing Sensitivity: 2 mV

## 2021-02-14 NOTE — Progress Notes (Signed)
Remote pacemaker transmission.   

## 2021-04-04 ENCOUNTER — Other Ambulatory Visit: Payer: Self-pay | Admitting: Interventional Cardiology

## 2021-05-06 ENCOUNTER — Other Ambulatory Visit: Payer: Self-pay | Admitting: Interventional Cardiology

## 2021-05-07 ENCOUNTER — Ambulatory Visit (INDEPENDENT_AMBULATORY_CARE_PROVIDER_SITE_OTHER): Payer: Medicare Other

## 2021-05-07 DIAGNOSIS — I495 Sick sinus syndrome: Secondary | ICD-10-CM | POA: Diagnosis not present

## 2021-05-08 LAB — CUP PACEART REMOTE DEVICE CHECK
Battery Impedance: 2921 Ohm
Battery Remaining Longevity: 31 mo
Battery Voltage: 2.72 V
Brady Statistic RV Percent Paced: 26 %
Date Time Interrogation Session: 20230308122317
Implantable Lead Implant Date: 20090506
Implantable Lead Implant Date: 20090506
Implantable Lead Location: 753859
Implantable Lead Location: 753860
Implantable Lead Model: 4024
Implantable Pulse Generator Implant Date: 20090506
Lead Channel Impedance Value: 580 Ohm
Lead Channel Impedance Value: 67 Ohm
Lead Channel Pacing Threshold Amplitude: 0.875 V
Lead Channel Pacing Threshold Pulse Width: 0.4 ms
Lead Channel Setting Pacing Amplitude: 2.5 V
Lead Channel Setting Pacing Pulse Width: 0.4 ms
Lead Channel Setting Sensing Sensitivity: 2 mV

## 2021-05-20 NOTE — Progress Notes (Signed)
Remote pacemaker transmission.   

## 2021-08-04 ENCOUNTER — Other Ambulatory Visit: Payer: Self-pay | Admitting: Interventional Cardiology

## 2021-08-05 ENCOUNTER — Other Ambulatory Visit: Payer: Self-pay | Admitting: Interventional Cardiology

## 2021-08-05 MED ORDER — DAPAGLIFLOZIN PROPANEDIOL 10 MG PO TABS: 10.0000 mg | ORAL_TABLET | Freq: Every day | ORAL | 0 refills | Status: DC

## 2021-08-06 ENCOUNTER — Ambulatory Visit (INDEPENDENT_AMBULATORY_CARE_PROVIDER_SITE_OTHER): Payer: Medicare Other

## 2021-08-06 DIAGNOSIS — I495 Sick sinus syndrome: Secondary | ICD-10-CM

## 2021-08-07 LAB — CUP PACEART REMOTE DEVICE CHECK
Battery Impedance: 3293 Ohm
Battery Remaining Longevity: 27 mo
Battery Voltage: 2.7 V
Brady Statistic RV Percent Paced: 30 %
Date Time Interrogation Session: 20230607115718
Implantable Lead Implant Date: 20090506
Implantable Lead Implant Date: 20090506
Implantable Lead Location: 753859
Implantable Lead Location: 753860
Implantable Lead Model: 4024
Implantable Pulse Generator Implant Date: 20090506
Lead Channel Impedance Value: 572 Ohm
Lead Channel Impedance Value: 67 Ohm
Lead Channel Pacing Threshold Amplitude: 0.5 V
Lead Channel Pacing Threshold Pulse Width: 0.4 ms
Lead Channel Setting Pacing Amplitude: 2.5 V
Lead Channel Setting Pacing Pulse Width: 0.4 ms
Lead Channel Setting Sensing Sensitivity: 2 mV

## 2021-08-19 ENCOUNTER — Other Ambulatory Visit: Payer: Self-pay | Admitting: Interventional Cardiology

## 2021-08-20 NOTE — Progress Notes (Signed)
Remote pacemaker transmission.   

## 2021-09-02 ENCOUNTER — Other Ambulatory Visit: Payer: Self-pay | Admitting: Interventional Cardiology

## 2021-09-08 NOTE — Progress Notes (Unsigned)
Cardiology Office Note Date:  09/08/2021  Patient ID:  Timothy Harper, Timothy Harper 1932/05/04, MRN [id number] PCP:  Lavone Orn, MD  Cardiologist:  Dr. Tamala Julian Electrophysiologist: Dr. Lovena Le  ***refresh   Chief Complaint: *** overdue annual visit  History of Present Illness: Timothy Harper is a 88 y.o. male with history of CAD (remote CABG and PCI), AFib, tachy-brady with PPM, HTN, stroke, chronic CHF (systolic and developed well past PPM implant)  He comes in today to be seen for Dr. Lovena Le, last seen by him Jan 2020.  He was not rate controlled, though felt like watchful waiting was the best course of action at that tie, described class II symptoms.  He has followed with Dr. Tamala Julian more regularly, last visit was May 2022, he was doing and feeling improved, LVEF was better by his last echo, breathing better.  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 Prior PCI on RCA with restenosis   CVA (cerebral infarction) 1995   CVA (cerebral infarction)    Right   Erectile dysfunction    GERD (gastroesophageal reflux disease)    Hearing loss    hearing aids   Herpes zoster    HTN (hypertension)    Hyperlipemia    Hypothyroidism    Long term (current) use of anticoagulants    Melanoma ([place])    right ear, Rosana Hoes 2002 and left arm 1997, [facility]   Primary male hypogonadism     Past Surgical History:  Procedure  Laterality Date   RIGHT/LEFT HEART CATH AND CORONARY/GRAFT ANGIOGRAPHY N/A 08/26/2016   08/26/16- Patent bypass grafts    Current Outpatient Medications  Medication Sig Dispense Refill   acetaminophen (TYLENOL) 500 MG tablet Take 1,000 mg by mouth 2 (two) times daily as needed for moderate pain or headache.     atorvastatin (LIPITOR) 20 MG tablet Take 20 mg by mouth at bedtime.      carvedilol (COREG) 25 MG tablet Take 1 tablet (25 mg total) by mouth 2 (two) times daily with a meal. 180 tablet 3   Cholecalciferol (VITAMIN D3) 1000 units CAPS Take 1,000 Units by mouth daily.     colchicine 0.6 MG tablet Take 0.6 mg by mouth daily as needed (gout pain). On hold as of 10/08/16     dapagliflozin propanediol (FARXIGA) 10 MG TABS tablet Take 1 tablet (10 mg total) by mouth daily before breakfast. Please make overdue appt with Dr. Tamala Julian before anymore refills. Thank you 2nd attempt 15 tablet 0   digoxin (LANOXIN) 0.125 MG tablet TAKE (1) TABLET ON MONDAY, WEDNESDAY AND FRIDAY. 36 tablet 2   febuxostat (ULORIC) 40 MG tablet Take 40 mg by mouth daily.     furosemide (LASIX) 20 MG tablet Take 1 tablet (20 mg total) by mouth daily. 15 tablet 0   levothyroxine (SYNTHROID, LEVOTHROID) 112 MCG tablet Take 112 mcg by  mouth daily before breakfast.     Magnesium 250 MG TABS Take 250 mg by mouth daily.     oseltamivir (TAMIFLU) 30 MG capsule Take 1 capsule (30 mg total) by mouth 2 (two) times daily. 10 capsule 0   potassium chloride (KLOR-CON) 10 MEQ tablet TAKE 1 TABLET EACH DAY. 15 tablet 0   sacubitril-valsartan (ENTRESTO) 24-26 MG Take 1 tablet by mouth 2 (two) times daily. Must keep upcoming overdue appointment for future refills. Thank  You. 60 tablet 0   tamsulosin (FLOMAX) 0.4 MG CAPS capsule Take 0.4 mg by mouth every evening.      Testosterone (FORTESTA) 10 MG/ACT (2%) GEL Place 1 Dose onto the skin 3 (three) times daily.     warfarin (COUMADIN) 5 MG tablet Take 5 mg by mouth See admin instructions. Take  '5mg'$ s by mouth daily except fridays.     No current facility-administered medications for this visit.    Allergies:   Allopurinol, Penicillins, and Prednisone   Social History:  The patient  reports that he quit smoking about 33 years ago. He has never used smokeless tobacco. He reports current alcohol use. He reports that he does not use drugs.   Family History:  The patient's family history includes Heart disease in his mother.  ROS:  Please see the history of present illness.    All other systems are reviewed and otherwise negative.   PHYSICAL EXAM:  VS:  There were no vitals taken for this visit. BMI: There is no height or weight on file to calculate BMI. Well nourished, well developed, in no acute distress HEENT: normocephalic, atraumatic Neck: no JVD, carotid bruits or masses Cardiac:  *** RRR; no significant murmurs, no rubs, or gallops Lungs:  *** CTA b/l, no wheezing, rhonchi or rales Abd: soft, nontender MS: no deformity or *** atrophy Ext: *** no edema Skin: warm and dry, no rash Neuro:  No gross deficits appreciated Psych: euthymic mood, full affect  *** PPM site is stable, no tethering or discomfort   EKG:  Done today and reviewed by myself shows  ***  Device interrogation done today and reviewed by myself:  ***   03/07/20: TTE  1. Left ventricular ejection fraction, by estimation, is 40 to 45%. Left  ventricular ejection fraction by 3D volume is 42 %. The left ventricle has  mildly decreased function. The left ventricle demonstrates global  hypokinesis. Left ventricular diastolic   parameters are consistent with Grade I diastolic dysfunction (impaired  relaxation).   2. Right ventricular systolic function is normal. The right ventricular  size is moderately enlarged. There is normal pulmonary artery systolic  pressure.   3. Left atrial size was moderately dilated.   4. Right atrial size was severely dilated.   5. The mitral valve is normal in structure.  Mild mitral valve  regurgitation. No evidence of mitral stenosis.   6. The aortic valve is tricuspid. Aortic valve regurgitation is not  visualized. Mild aortic valve sclerosis is present, with no evidence of  aortic valve stenosis.   7. The inferior vena cava is normal in size with greater than 50%  respiratory variability, suggesting right atrial pressure of 3 mmHg.    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 Patent right internal mammary graft to the distal RCA. 70% obstruction in the second left ventricular branch of the distal right coronary Widely patent left main Widely patent proximal to mid  LAD stent. Proximal and mid Ernie percent narrowing within the LAD. Widely patent circumflex coronary artery. Upper normal right heart pressures with mildly elevated right atrial pressure. Normal mean pulmonary capillary wedge pressure of 13 mmHg. Dilated and globally hypocontractile left ventricle. LVEF 25-30%. Normal LVEDP at 11 mmHg. Findings are compatible with chronic compensated systolic heart failure.     RECOMMENDATIONS:   Medical therapy and optimization We will need electrophysiology opinion concerning possible AICD, although currently on medical therapy the patient is well compensated and may not be needed. Resume Coumadin and Lovenox today as scheduled by pharmacy. First dose of Lovenox should be in a.m.   Recent Labs: No results found for requested labs within last 365 days.  No results found for requested labs within last 365 days.   CrCl cannot be calculated (Patient's most recent lab result is older than the maximum 21 days allowed.).   Wt Readings from Last 3 Encounters:  07/25/20 209 lb 6.4 oz (95 kg)  06/07/20 206 lb (93.4 kg)  01/25/20 222 lb 3.2 oz (100.8 kg)     Other studies reviewed: Additional studies/records reviewed today include: summarized above  ASSESSMENT AND  PLAN:  PPM ***  Permanent AFib CHA2DS2Vasc is 7, on warfarin *** rate control  CAD Chronic CHF (systolic) LVEF improved to the 40's last echo ***   Disposition: F/u with ***  Current medicines are reviewed at length with the patient today.  The patient did not have any concerns regarding medicines.  Venetia Night, PA-C 09/08/2021 7:17 PM     [address] [address] [id number] [phone] (office)  [phone] (fax)  \

## 2021-09-11 ENCOUNTER — Encounter: Payer: Self-pay | Admitting: Physician Assistant

## 2021-09-11 ENCOUNTER — Ambulatory Visit: Payer: Medicare Other | Admitting: Physician Assistant

## 2021-09-11 VITALS — BP 92/60 | HR 64 | Ht 70.0 in | Wt 212.0 lb

## 2021-09-11 DIAGNOSIS — I5042 Chronic combined systolic (congestive) and diastolic (congestive) heart failure: Secondary | ICD-10-CM

## 2021-09-11 DIAGNOSIS — I10 Essential (primary) hypertension: Secondary | ICD-10-CM

## 2021-09-11 DIAGNOSIS — Z950 Presence of cardiac pacemaker: Secondary | ICD-10-CM

## 2021-09-11 DIAGNOSIS — I4821 Permanent atrial fibrillation: Secondary | ICD-10-CM | POA: Diagnosis not present

## 2021-09-11 DIAGNOSIS — I2510 Atherosclerotic heart disease of native coronary artery without angina pectoris: Secondary | ICD-10-CM

## 2021-09-11 LAB — CUP PACEART INCLINIC DEVICE CHECK
Battery Impedance: 3471 Ohm
Battery Remaining Longevity: 25 mo
Battery Voltage: 2.69 V
Brady Statistic RV Percent Paced: 31 %
Date Time Interrogation Session: 20230712182745
Implantable Lead Implant Date: 20090506
Implantable Lead Implant Date: 20090506
Implantable Lead Location: 753859
Implantable Lead Location: 753860
Implantable Lead Model: 4024
Implantable Pulse Generator Implant Date: 20090506
Lead Channel Impedance Value: 592 Ohm
Lead Channel Impedance Value: 67 Ohm
Lead Channel Pacing Threshold Amplitude: 0.75 V
Lead Channel Pacing Threshold Amplitude: 0.875 V
Lead Channel Pacing Threshold Pulse Width: 0.4 ms
Lead Channel Pacing Threshold Pulse Width: 0.4 ms
Lead Channel Setting Pacing Amplitude: 2.5 V
Lead Channel Setting Pacing Pulse Width: 0.4 ms
Lead Channel Setting Sensing Sensitivity: 2 mV

## 2021-09-11 MED ORDER — CARVEDILOL 12.5 MG PO TABS: 12.5000 mg | ORAL_TABLET | Freq: Two times a day (BID) | ORAL | 3 refills | Status: DC

## 2021-09-11 NOTE — Patient Instructions (Signed)
Medication Instructions:  Your physician has recommended you make the following change in your medication:  DECREASE COREG TO 12.5 MG TWICE DAILY  *If you need a refill on your cardiac medications before your next appointment, please call your pharmacy*   Lab Work: TO BE DONE ON 8/7: BMET, CBC, DIGOXIN LEVEL  If you have labs (blood work) drawn today and your tests are completely normal, you will receive your results only by: [place] (if you have MyChart) OR A paper copy in the mail If you have any lab test that is abnormal or we need to change your treatment, we will call you to review the results.   Testing/Procedures: NONE   Follow-Up: At [facility], you and your health needs are our priority.  As part of our continuing mission to provide you with exceptional heart care, we have created designated Provider Care Teams.  These Care Teams include your primary Cardiologist (physician) and Advanced Practice Providers (APPs -  Physician Assistants and Nurse Practitioners) who all work together to provide you with the care you need, when you need it.  We recommend signing up for the patient portal called "MyChart".  Sign up information is provided on this After Visit Summary.  MyChart is used to connect with patients for Virtual Visits (Telemedicine).  Patients are able to view lab/test results, encounter notes, upcoming appointments, etc.  Non-urgent messages can be sent to your provider as well.   To learn more about what you can do with MyChart, go to [email or web address].    Your next appointment:   1 year(s)  The format for your next appointment:   In Person  Provider:   You may see DR. Lovena Le or one of the following Advanced Practice Providers on your designated Care Team:   Tommye Standard, [place] Legrand Como "Jonni Sanger" Chalmers Cater, PA-C{  Important Information About Sugar

## 2021-09-12 ENCOUNTER — Telehealth: Payer: Self-pay

## 2021-09-12 ENCOUNTER — Other Ambulatory Visit: Payer: Self-pay | Admitting: Interventional Cardiology

## 2021-09-12 MED ORDER — DAPAGLIFLOZIN PROPANEDIOL 10 MG PO TABS: 10.0000 mg | ORAL_TABLET | Freq: Every day | ORAL | 0 refills | Status: DC

## 2021-09-12 NOTE — Telephone Encounter (Signed)
-----   Message from [facility], [place] sent at 09/11/2021  5:30 PM EDT ----- He sees dr. Tamala Julian 8/7 I think.  I reduced his coreg for hypotension today Can he please also see device clinic same day to check his pacer for HR histograms eval  PLEASE and THANKS  renee

## 2021-09-12 NOTE — Telephone Encounter (Signed)
Successful telephone encounter to patient to advise of device clinic appointment 10/07/21 at 3:45 pm to check HR histograms s/p medication adjustments made 09/11/21 per R. Charlcie Cradle, [place]. Patient verbalized understanding.

## 2021-10-05 NOTE — Progress Notes (Signed)
Cardiology Office Note:    Date:  10/07/2021   ID:  Timothy Harper, DOB [birth year], MRN [id number]  PCP:  Lavone Orn, MD  Cardiologist:  Sinclair Grooms, MD   Referring MD: Lavone Orn, MD   Chief Complaint  Patient presents with   Congestive Heart Failure   Coronary Artery Disease    History of Present Illness:    Timothy Harper is a 88 y.o. male with a hx of coronary artery disease with prior coronary bypass grafting and native vessel PCI, tachybradycardia syndrome with atrial fibrillation, hypertension, embolic CVA, chronic anticoagulation therapy, eventual development of systolic heart failure EF 30%, 2020 improving to 45%, 2022 on heart failure therapy.  He is asymptomatic.  Not much physical activity because of balance.  He feels somewhat lightheaded when he stands.  Lower extremity edema has completely resolved since adding [place].  He has not fainted.  He is not having angina.  He is compliant with his medication regimen.  No recent laboratory data.  Past Medical History:  Diagnosis Date   Anomalous origin of coronary artery 2000   Stent LAD    Atrial fibrillation (HCC)    BPH (benign prostatic hyperplasia)    Cardiac pacemaker in situ    Chronic atrial fibrillation (HCC)    Coronary artery disease involving coronary bypass graft 1994   LIMA to RCA . Prior PCI on RCA with restenosis   CVA (cerebral infarction) 1995   CVA (cerebral infarction)    Right   Erectile dysfunction    GERD (gastroesophageal reflux disease)    Hearing loss    hearing aids   Herpes zoster    HTN (hypertension)    Hyperlipemia    Hypothyroidism    Long term (current) use of anticoagulants    Melanoma ([place])    right ear, Rosana Hoes 2002 and left arm 1997, [facility]   Primary male hypogonadism     Past Surgical History:  Procedure Laterality Date   RIGHT/LEFT HEART CATH AND CORONARY/GRAFT ANGIOGRAPHY N/A 08/26/2016   08/26/16- Patent bypass grafts    Current  Medications: Current Meds  Medication Sig   acetaminophen (TYLENOL) 500 MG tablet Take 1,000 mg by mouth 2 (two) times daily as needed for moderate pain or headache.   atorvastatin (LIPITOR) 20 MG tablet Take 20 mg by mouth at bedtime.    carvedilol (COREG) 12.5 MG tablet Take 1 tablet (12.5 mg total) by mouth 2 (two) times daily.   Cholecalciferol (VITAMIN D3) 1000 units CAPS Take 1,000 Units by mouth daily.   colchicine 0.6 MG tablet Take 0.6 mg by mouth daily as needed (gout pain). On hold as of 10/08/16   dapagliflozin propanediol (FARXIGA) 10 MG TABS tablet Take 1 tablet (10 mg total) by mouth daily before breakfast.   digoxin (LANOXIN) 0.125 MG tablet TAKE (1) TABLET ON MONDAY, WEDNESDAY AND FRIDAY.   febuxostat (ULORIC) 40 MG tablet Take 40 mg by mouth daily.   furosemide (LASIX) 20 MG tablet Take 1 tablet (20 mg total) by mouth daily.   levothyroxine (SYNTHROID, LEVOTHROID) 112 MCG tablet Take 112 mcg by mouth daily before breakfast.   Magnesium 250 MG TABS Take 250 mg by mouth daily.   oseltamivir (TAMIFLU) 30 MG capsule Take 1 capsule (30 mg total) by mouth 2 (two) times daily.   potassium chloride (KLOR-CON) 10 MEQ tablet Take 1 tablet (10 mEq total) by mouth daily.   sacubitril-valsartan (ENTRESTO) 24-26 MG Take 1 tablet by mouth 2 (two)  times daily. Must keep upcoming overdue appointment for future refills. Thank  You.   tamsulosin (FLOMAX) 0.4 MG CAPS capsule Take 0.4 mg by mouth every evening.    warfarin (COUMADIN) 5 MG tablet Take 5 mg by mouth See admin instructions. Take '5mg'$ s by mouth daily except fridays.     Allergies:   Allopurinol, Penicillins, and Prednisone   Social History   Socioeconomic History   Marital status: Married    Spouse name: Not on file   Number of children: 4   Years of education: Not on file   Highest education level: Not on file  Occupational History   Occupation: Music therapist    Comment: retired  Tobacco Use   Smoking status: Former     Types: Cigarettes    Quit date: 08/01/1988    Years since quitting: 33.2   Smokeless tobacco: Never  Vaping Use   Vaping Use: Never used  Substance and Sexual Activity   Alcohol use: Yes    Comment: 2-3 times a week   Drug use: No   Sexual activity: Not on file  Other Topics Concern   Not on file  Social History Narrative   Not on file   Social Determinants of Health   Financial Resource Strain: Not on file  Food Insecurity: Not on file  Transportation Needs: Not on file  Physical Activity: Not on file  Stress: Not on file  Social Connections: Not on file     Family History: The patient's family history includes Heart disease in his mother.  ROS:   Please see the history of present illness.    No blood in the urine or stool.  Difficulty with balance.  All other systems reviewed and are negative.  EKGs/Labs/Other Studies Reviewed:    The following studies were reviewed today: ECHO 2022: IMPRESSIONS    1. Left ventricular ejection fraction, by estimation, is 40 to 45%. Left  ventricular ejection fraction by 3D volume is 42 %. The left ventricle has  mildly decreased function. The left ventricle demonstrates global  hypokinesis. Left ventricular diastolic   parameters are consistent with Grade I diastolic dysfunction (impaired  relaxation).   2. Right ventricular systolic function is normal. The right ventricular  size is moderately enlarged. There is normal pulmonary artery systolic  pressure.   3. Left atrial size was moderately dilated.   4. Right atrial size was severely dilated.   5. The mitral valve is normal in structure. Mild mitral valve  regurgitation. No evidence of mitral stenosis.   6. The aortic valve is tricuspid. Aortic valve regurgitation is not  visualized. Mild aortic valve sclerosis is present, with no evidence of  aortic valve stenosis.   7. The inferior vena cava is normal in size with greater than 50%  respiratory variability, suggesting  right atrial pressure of 3 mmHg.   EKG:  EKG not repeated  Recent Labs: No results found for requested labs within last 365 days.  Recent Lipid Panel No results found for: "CHOL", "TRIG", "HDL", "CHOLHDL", "VLDL", "LDLCALC", "LDLDIRECT"  Physical Exam:    VS:  BP (!) 98/58   Pulse 62   Ht '5\' 10"'$  (1.778 m)   Wt 211 lb 12.8 oz (96.1 kg)   SpO2 96%   BMI 30.39 kg/m     Wt Readings from Last 3 Encounters:  10/07/21 211 lb 12.8 oz (96.1 kg)  09/11/21 212 lb (96.2 kg)  07/25/20 209 lb 6.4 oz (95 kg)  GEN: Elderly but not frail. No acute distress HEENT: Normal NECK: No JVD. LYMPHATICS: No lymphadenopathy CARDIAC: No murmur. RRR no gallop, or edema. VASCULAR:  Normal Pulses. No bruits. RESPIRATORY:  Clear to auscultation without rales, wheezing or rhonchi  ABDOMEN: Soft, non-tender, non-distended, No pulsatile mass, MUSCULOSKELETAL: No deformity  SKIN: Warm and dry NEUROLOGIC:  Alert and oriented x 3 PSYCHIATRIC:  Normal affect   ASSESSMENT:    1. Chronic combined systolic and diastolic heart failure ([place])   2. Permanent atrial fibrillation ([place])   3. Essential hypertension   4. Cardiac pacemaker in situ   5. Coronary artery disease involving native coronary artery of native heart without angina pectoris   6. Cerebrovascular accident (CVA), unspecified mechanism ([place])    PLAN:    In order of problems listed above:  Continue carvedilol, Farxiga, Lanoxin (dig level be ordered today), and Entresto.  Discontinue furosemide. Being followed in device clinic.  Continue Coumadin indefinitely. Blood pressure is low.  Probably over diuresed.  Basic metabolic panel will be done today.  Discontinue furosemide 20 mg/day. Continue to follow with Dr. Crissie Sickles.   Secondary prevention discussed. No new neurological complaints.   On return he will be established with a new primary cardiologist.  8 to 47-monthfollow-up.  Discontinuing furosemide because of hypotension.  Call  [place] weight gain or edema develops.  Overall education and awareness concerning secondary risk prevention was discussed in detail: LDL less than 70, hemoglobin A1c less than 7, blood pressure target less than 130/80 mmHg, >150 minutes of moderate aerobic activity per week, avoidance of smoking, weight control (via diet and exercise), and continued surveillance/management of/for obstructive sleep apnea.   Medication Adjustments/Labs and Tests Ordered: Current medicines are reviewed at length with the patient today.  Concerns regarding medicines are outlined above.  No orders of the defined types were placed in this encounter.  No orders of the defined types were placed in this encounter.   There are no Patient Instructions on file for this visit.   Signed, HSinclair Grooms MD  10/07/2021 3:46 PM    [place]

## 2021-10-07 ENCOUNTER — Encounter: Payer: Self-pay | Admitting: Interventional Cardiology

## 2021-10-07 ENCOUNTER — Ambulatory Visit: Payer: Medicare Other | Admitting: Interventional Cardiology

## 2021-10-07 ENCOUNTER — Ambulatory Visit (INDEPENDENT_AMBULATORY_CARE_PROVIDER_SITE_OTHER): Payer: Medicare Other

## 2021-10-07 ENCOUNTER — Other Ambulatory Visit: Payer: Medicare Other

## 2021-10-07 VITALS — BP 98/58 | HR 62 | Ht 70.0 in | Wt 211.8 lb

## 2021-10-07 DIAGNOSIS — I10 Essential (primary) hypertension: Secondary | ICD-10-CM | POA: Diagnosis not present

## 2021-10-07 DIAGNOSIS — I5042 Chronic combined systolic (congestive) and diastolic (congestive) heart failure: Secondary | ICD-10-CM

## 2021-10-07 DIAGNOSIS — I639 Cerebral infarction, unspecified: Secondary | ICD-10-CM

## 2021-10-07 DIAGNOSIS — I2510 Atherosclerotic heart disease of native coronary artery without angina pectoris: Secondary | ICD-10-CM

## 2021-10-07 DIAGNOSIS — I4821 Permanent atrial fibrillation: Secondary | ICD-10-CM | POA: Diagnosis not present

## 2021-10-07 DIAGNOSIS — Z950 Presence of cardiac pacemaker: Secondary | ICD-10-CM

## 2021-10-07 DIAGNOSIS — I495 Sick sinus syndrome: Secondary | ICD-10-CM

## 2021-10-07 NOTE — Progress Notes (Signed)
Patient seen in device clinic to check histograms and send to [facility].

## 2021-10-07 NOTE — Patient Instructions (Signed)
Medication Instructions:  Your physician has recommended you make the following change in your medication:   1) STOP Furosemide (Lasix)  **Please call our office at [phone] if you have any swelling or weight gain (3 lbs overnight or 5 lbs in 1 week).**  *If you need a refill on your cardiac medications before your next appointment, please call your pharmacy*  Lab Work: TODAY: Digoxin level, CBC, BMET If you have labs (blood work) drawn today and your tests are completely normal, you will receive your results only by: [place] (if you have MyChart) OR A paper copy in the mail If you have any lab test that is abnormal or we need to change your treatment, we will call you to review the results.  Testing/Procedures: NONE  Follow-Up: At [facility], you and your health needs are our priority.  As part of our continuing mission to provide you with exceptional heart care, we have created designated Provider Care Teams.  These Care Teams include your primary Cardiologist (physician) and Advanced Practice Providers (APPs -  Physician Assistants and Nurse Practitioners) who all work together to provide you with the care you need, when you need it.  Your next appointment:   8-12 month(s)  The format for your next appointment:   In Person  Provider:   Sinclair Grooms, MD {   Important Information About Sugar

## 2021-10-08 LAB — BASIC METABOLIC PANEL
BUN/Creatinine Ratio: 14 (ref 10–24)
BUN: 16 mg/dL (ref 8–27)
CO2: 24 mmol/L (ref 20–29)
Calcium: 9.5 mg/dL (ref 8.6–10.2)
Chloride: 101 mmol/L (ref 96–106)
Creatinine, Ser: 1.15 mg/dL (ref 0.76–1.27)
Glucose: 94 mg/dL (ref 70–99)
Potassium: 4.4 mmol/L (ref 3.5–5.2)
Sodium: 142 mmol/L (ref 134–144)
eGFR: 62 mL/min/{1.73_m2} (ref >59)

## 2021-10-08 LAB — CBC
Hematocrit: 39.4 % (ref 37.5–51.0)
Hemoglobin: 13.6 g/dL (ref 13.0–17.7)
MCH: 31.9 pg (ref 26.6–33.0)
MCHC: 34.5 g/dL (ref 31.5–35.7)
MCV: 92 fL (ref 79–97)
Platelets: 250 10*3/uL (ref 150–450)
RBC: 4.27 x10E6/uL (ref 4.14–5.80)
RDW: 12.8 % (ref 11.6–15.4)
WBC: 7.3 10*3/uL (ref 3.4–10.8)

## 2021-10-08 LAB — DIGOXIN LEVEL: Digoxin, Serum: 0.5 ng/mL (ref 0.5–0.9)

## 2021-10-14 ENCOUNTER — Encounter: Payer: Self-pay | Admitting: Interventional Cardiology

## 2021-10-14 ENCOUNTER — Telehealth: Payer: Self-pay | Admitting: Interventional Cardiology

## 2021-10-14 NOTE — Telephone Encounter (Signed)
Last OV note 10/07/21:   Chronic combined systolic and diastolic heart failure   ---Continue carvedilol, Farxiga, Lanoxin (dig level be ordered today), and Entresto.  Discontinue furosemide. ----Blood pressure is low.  Probably over diuresed.  Basic metabolic panel will be done today.  Discontinue furosemide 20 mg/day.  The patient states that he has "really blown up" he noticed this on Saturday 8/12. When he is going to the bathroom he stands there for 30 minutes, "hardly anything comes out." Patient states he doesn't have swelling but his belly has gotten really big and its not from his food. No swelling in lower extremities. No missed doses of medication. Educated the patient about the importance on daily weights.  Weight: 207 pounds VS: 119/67, 67  Gave ED precautions. Patient verbalized understanding.  Will forward to MD for advisement.

## 2021-10-14 NOTE — Telephone Encounter (Signed)
error 

## 2021-10-14 NOTE — Telephone Encounter (Signed)
Patient is calling requesting to speak with a nurse stating he was advised to call the office if he had any issues since being taken off of his medication. When asked what issues he was having he stated he couldn't remember right now due to just waking up. Please advise.

## 2021-10-17 IMAGING — CR DG CHEST 2V
2 series · 2 of 2 positions shown · non-contrast
Comparison: September 27, 2008.

CLINICAL DATA: Shortness of breath.

EXAM:
CHEST - 2 VIEW

[w chest pa]
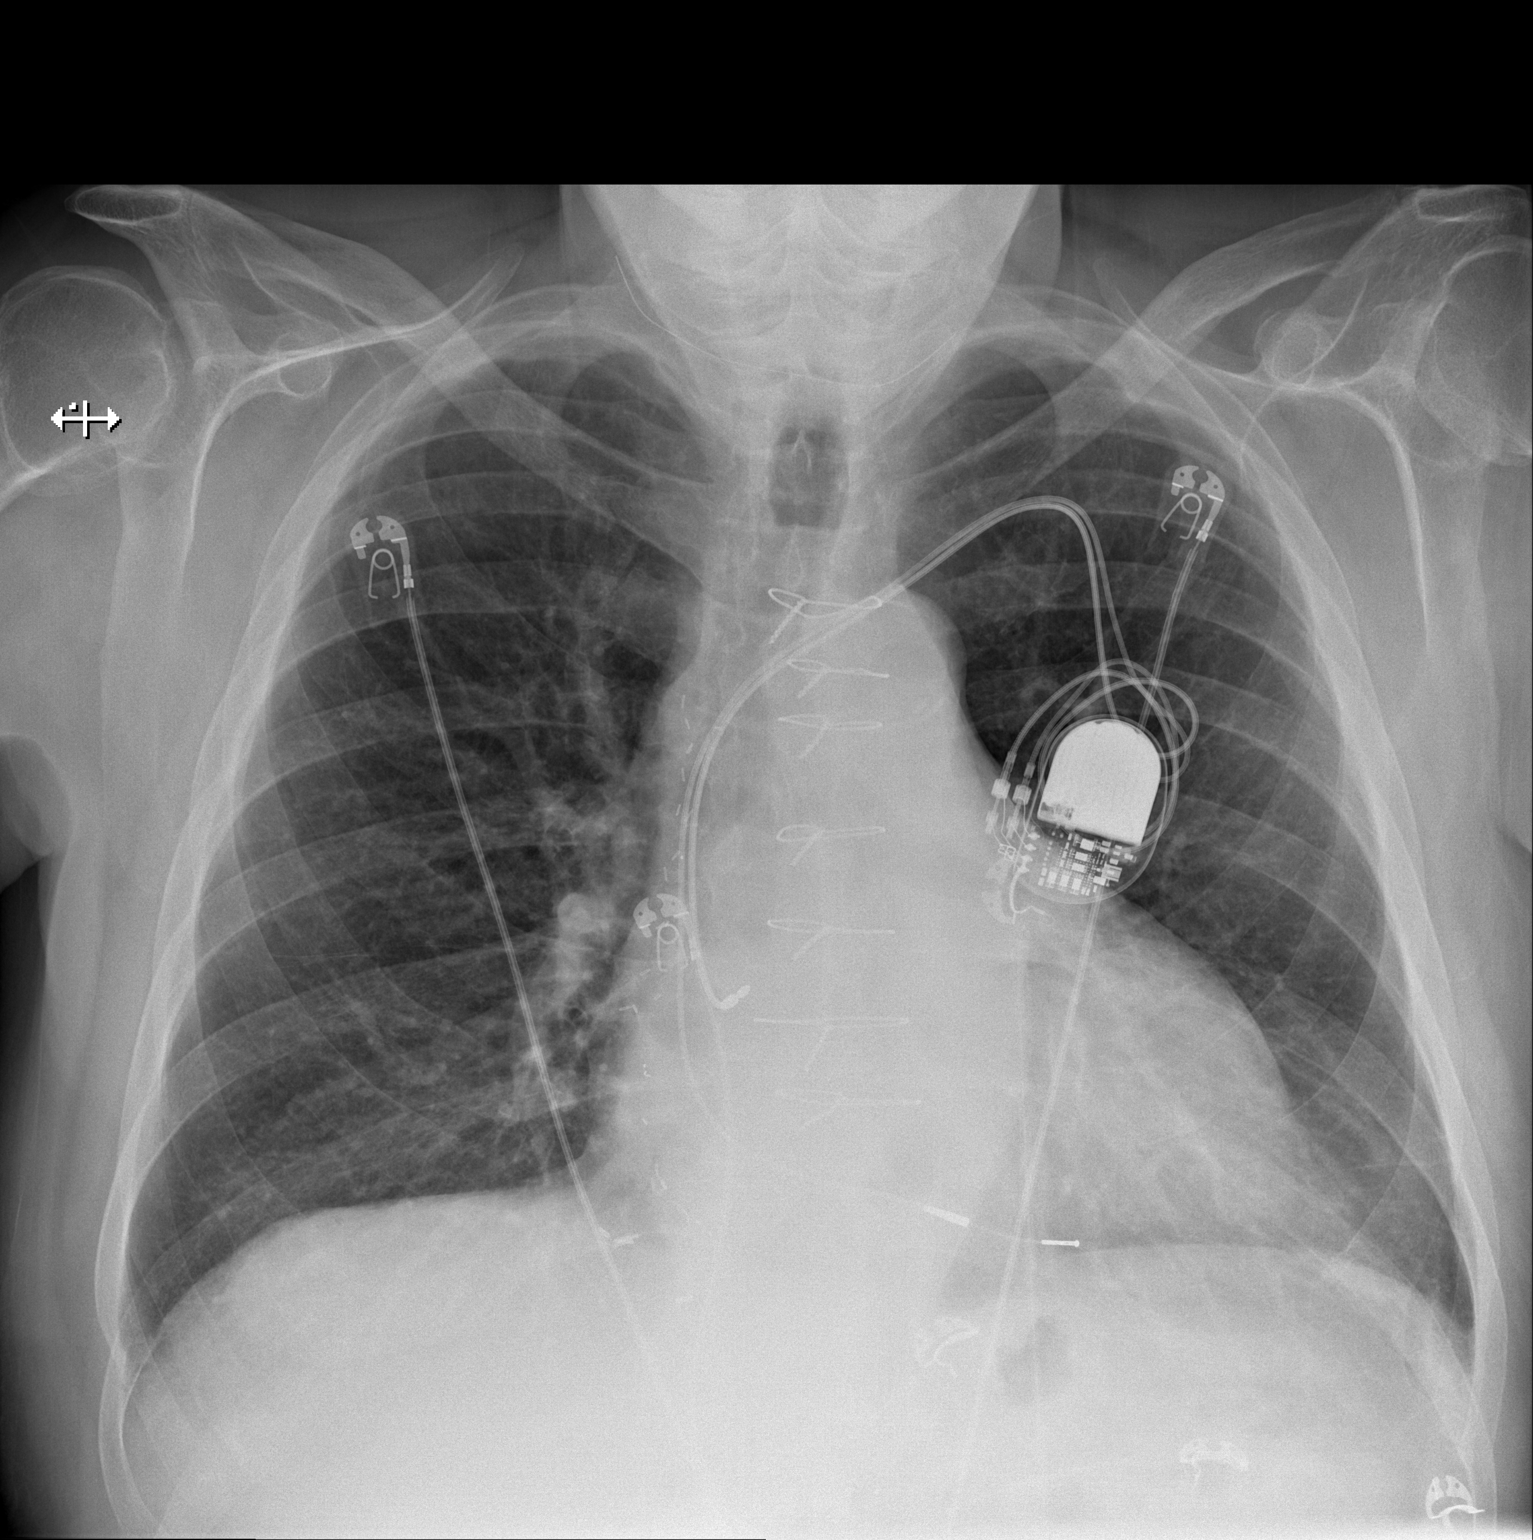

[w chest lat]
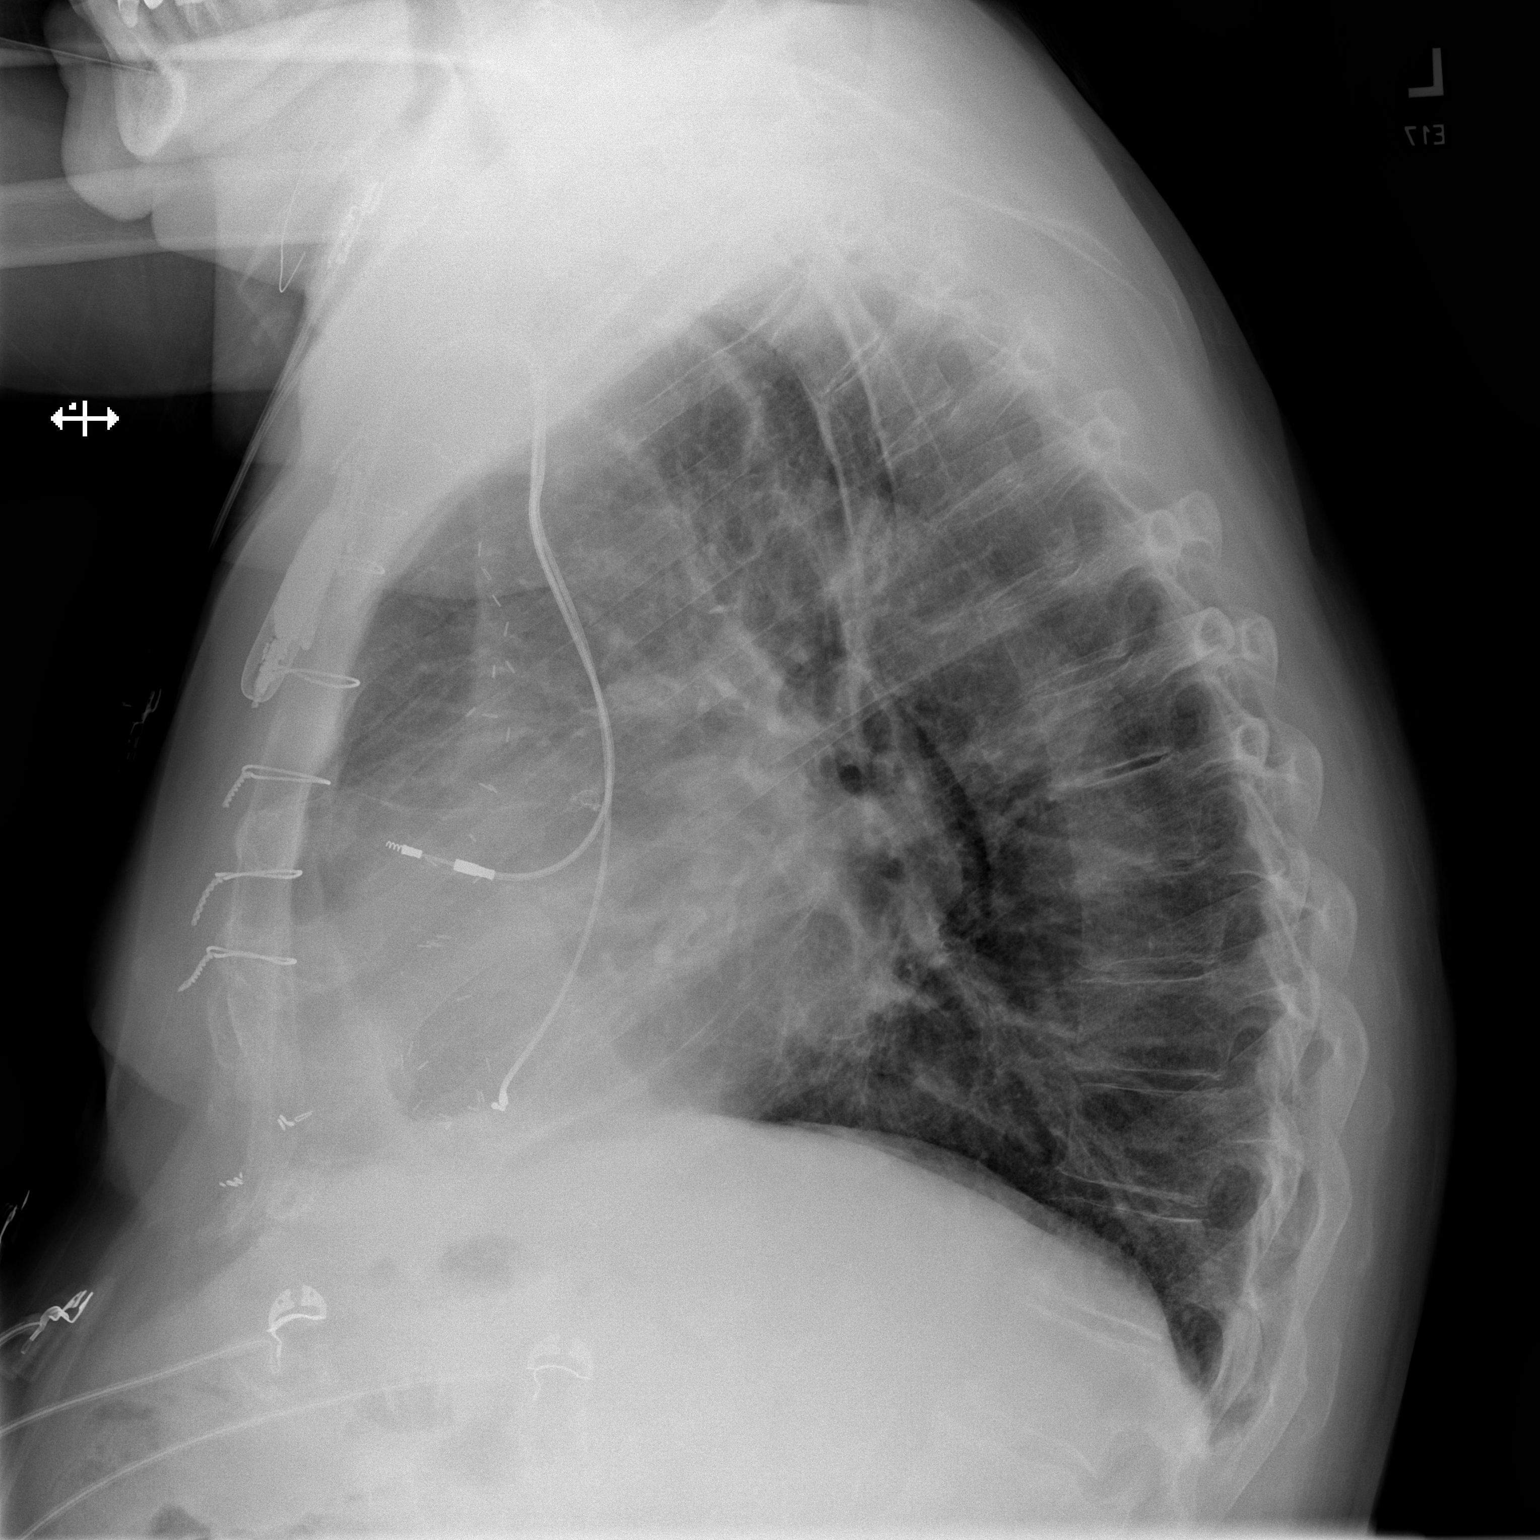

[2 of 2 positions shown; findings below may reference images not displayed]

FINDINGS: The heart size and mediastinal contours are within normal limits.
Left-sided pacemaker is unchanged in position. No pneumothorax or
pleural effusion is noted. Sternotomy wires are noted. Both lungs
are clear. The visualized skeletal structures are unremarkable.
IMPRESSION: No active cardiopulmonary disease.

## 2021-11-02 ENCOUNTER — Other Ambulatory Visit: Payer: Self-pay | Admitting: Interventional Cardiology

## 2021-11-05 ENCOUNTER — Ambulatory Visit (INDEPENDENT_AMBULATORY_CARE_PROVIDER_SITE_OTHER): Payer: Medicare Other

## 2021-11-05 DIAGNOSIS — I495 Sick sinus syndrome: Secondary | ICD-10-CM

## 2021-11-05 MED ORDER — ENTRESTO 24-26 MG PO TABS: 1.0000 | ORAL_TABLET | Freq: Two times a day (BID) | ORAL | 3 refills | Status: DC

## 2021-11-05 MED ORDER — DAPAGLIFLOZIN PROPANEDIOL 10 MG PO TABS: 10.0000 mg | ORAL_TABLET | Freq: Every day | ORAL | 3 refills | Status: DC

## 2021-11-08 LAB — CUP PACEART REMOTE DEVICE CHECK
Battery Impedance: 4284 Ohm
Battery Remaining Longevity: 17 mo
Battery Voltage: 2.66 V
Brady Statistic RV Percent Paced: 98 %
Date Time Interrogation Session: 20230908132007
Implantable Lead Implant Date: 20090506
Implantable Lead Implant Date: 20090506
Implantable Lead Location: 753859
Implantable Lead Location: 753860
Implantable Lead Model: 4024
Implantable Pulse Generator Implant Date: 20090506
Lead Channel Impedance Value: 566 Ohm
Lead Channel Impedance Value: 67 Ohm
Lead Channel Pacing Threshold Amplitude: 0.875 V
Lead Channel Pacing Threshold Pulse Width: 0.4 ms
Lead Channel Setting Pacing Amplitude: 2.5 V
Lead Channel Setting Pacing Pulse Width: 0.4 ms
Lead Channel Setting Sensing Sensitivity: 2 mV

## 2021-11-27 NOTE — Progress Notes (Signed)
Remote pacemaker transmission.   

## 2021-12-12 ENCOUNTER — Other Ambulatory Visit: Payer: Self-pay | Admitting: Interventional Cardiology

## 2021-12-12 NOTE — Telephone Encounter (Signed)
Pt's Warfarin is managed by PCP. Called pharmacy and requested refill request to be sent to PCP.

## 2022-02-04 ENCOUNTER — Ambulatory Visit (INDEPENDENT_AMBULATORY_CARE_PROVIDER_SITE_OTHER): Payer: Medicare Other

## 2022-02-04 DIAGNOSIS — I495 Sick sinus syndrome: Secondary | ICD-10-CM

## 2022-02-06 LAB — CUP PACEART REMOTE DEVICE CHECK
Battery Impedance: 6023 Ohm
Battery Remaining Longevity: 7 mo
Battery Voltage: 2.62 V
Brady Statistic RV Percent Paced: 97 %
Date Time Interrogation Session: 20231207105219
Implantable Lead Connection Status: 753985
Implantable Lead Connection Status: 753985
Implantable Lead Implant Date: 20090506
Implantable Lead Implant Date: 20090506
Implantable Lead Location: 753859
Implantable Lead Location: 753860
Implantable Lead Model: 4024
Implantable Pulse Generator Implant Date: 20090506
Lead Channel Impedance Value: 572 Ohm
Lead Channel Impedance Value: 67 Ohm
Lead Channel Pacing Threshold Amplitude: 0.875 V
Lead Channel Pacing Threshold Pulse Width: 0.4 ms
Lead Channel Setting Pacing Amplitude: 2.5 V
Lead Channel Setting Pacing Pulse Width: 0.4 ms
Lead Channel Setting Sensing Sensitivity: 2 mV
Zone Setting Status: 755011
Zone Setting Status: 755011

## 2022-03-05 NOTE — Progress Notes (Signed)
Remote pacemaker transmission.   

## 2022-05-06 ENCOUNTER — Ambulatory Visit (INDEPENDENT_AMBULATORY_CARE_PROVIDER_SITE_OTHER): Payer: Medicare Other

## 2022-05-06 DIAGNOSIS — I495 Sick sinus syndrome: Secondary | ICD-10-CM | POA: Diagnosis not present

## 2022-05-09 ENCOUNTER — Telehealth: Payer: Self-pay

## 2022-05-09 LAB — CUP PACEART REMOTE DEVICE CHECK
Battery Impedance: 9675 Ohm
Battery Voltage: 2.56 V
Brady Statistic RV Percent Paced: 97 %
Date Time Interrogation Session: 20240307105627
Implantable Lead Connection Status: 753985
Implantable Lead Connection Status: 753985
Implantable Lead Implant Date: 20090506
Implantable Lead Implant Date: 20090506
Implantable Lead Location: 753859
Implantable Lead Location: 753860
Implantable Lead Model: 4024
Implantable Pulse Generator Implant Date: 20090506
Lead Channel Impedance Value: 571 Ohm
Lead Channel Impedance Value: 67 Ohm
Lead Channel Setting Pacing Amplitude: 2.5 V
Lead Channel Setting Pacing Pulse Width: 0.4 ms
Lead Channel Setting Sensing Sensitivity: 2 mV
Zone Setting Status: 755011
Zone Setting Status: 755011

## 2022-05-09 NOTE — Telephone Encounter (Signed)
Spoke with patient informed him that his PPM had reached ERI on 03/26/22 patient agreeable to apt on 3/0/24 at 11:40 with Oda Kilts to discuss generator change out.

## 2022-05-21 ENCOUNTER — Ambulatory Visit: Payer: Medicare Other | Attending: Student | Admitting: Student

## 2022-05-21 ENCOUNTER — Encounter: Payer: Self-pay | Admitting: Student

## 2022-05-21 VITALS — BP 114/76 | HR 65 | Ht 70.0 in | Wt 217.0 lb

## 2022-05-21 DIAGNOSIS — I10 Essential (primary) hypertension: Secondary | ICD-10-CM | POA: Diagnosis not present

## 2022-05-21 DIAGNOSIS — I495 Sick sinus syndrome: Secondary | ICD-10-CM | POA: Diagnosis not present

## 2022-05-21 DIAGNOSIS — I5042 Chronic combined systolic (congestive) and diastolic (congestive) heart failure: Secondary | ICD-10-CM

## 2022-05-21 DIAGNOSIS — I2510 Atherosclerotic heart disease of native coronary artery without angina pectoris: Secondary | ICD-10-CM

## 2022-05-21 DIAGNOSIS — I4821 Permanent atrial fibrillation: Secondary | ICD-10-CM

## 2022-05-21 LAB — CUP PACEART INCLINIC DEVICE CHECK
Battery Impedance: 10078 Ohm
Battery Voltage: 2.56 V
Brady Statistic RV Percent Paced: 97 %
Date Time Interrogation Session: 20240320124047
Implantable Lead Connection Status: 753985
Implantable Lead Connection Status: 753985
Implantable Lead Implant Date: 20090506
Implantable Lead Implant Date: 20090506
Implantable Lead Location: 753859
Implantable Lead Location: 753860
Implantable Lead Model: 4024
Implantable Pulse Generator Implant Date: 20090506
Lead Channel Impedance Value: 562 Ohm
Lead Channel Impedance Value: 67 Ohm
Lead Channel Pacing Threshold Amplitude: 2.5 V
Lead Channel Pacing Threshold Pulse Width: 0.4 ms
Lead Channel Setting Pacing Amplitude: 2.5 V
Lead Channel Setting Pacing Pulse Width: 0.4 ms
Lead Channel Setting Sensing Sensitivity: 2 mV
Zone Setting Status: 755011
Zone Setting Status: 755011

## 2022-05-21 NOTE — Patient Instructions (Addendum)
Medication Instructions:  Your physician recommends that you continue on your current medications as directed. Please refer to the Current Medication list given to you today.  *If you need a refill on your cardiac medications before your next appointment, please call your pharmacy*   Lab Work: BMET and CBC on 06/16/22, you can come anytime 7:15 AM-4:30 PM, you do not need to be fasting If you have labs (blood work) drawn today and your tests are completely normal, you will receive your results only by: [place] (if you have MyChart) OR A paper copy in the mail If you have any lab test that is abnormal or we need to change your treatment, we will call you to review the results.   Testing/Procedures: See instruction letter  Follow-Up: At [facility], you and your health needs are our priority.  As part of our continuing mission to provide you with exceptional heart care, we have created designated Provider Care Teams.  These Care Teams include your primary Cardiologist (physician) and Advanced Practice Providers (APPs -  Physician Assistants and Nurse Practitioners) who all work together to provide you with the care you need, when you need it.  Your next appointment:   We will call you to schedule follow up appointments

## 2022-05-21 NOTE — Progress Notes (Addendum)
  Electrophysiology Office Note:   Date:  05/21/2022  ID:  Timothy Harper, DOB 1933/01/12, MRN [id number]  Primary Cardiologist: Timothy Grooms, MD (Inactive) Electrophysiologist: Timothy Peru, MD   History of Present Illness:   Timothy Harper is a 89 y.o. male for routine electrophysiology followup. Since last being seen in our clinic the patient reports doing well overall. He does have fecal incontinence and is very hard of hearing. He has not noticed a difference in symptoms being VVI 65.  he denies chest pain, palpitations, dyspnea, PND, orthopnea, nausea, vomiting, dizziness, syncope, edema, weight gain, or early satiety.   Review of systems complete and found to be negative unless listed in HPI.   Device History: MDT dual chamber implanted 1995, gen change 2002 -> 2009 Leads appear to be from 1995 (!)  Studies Reviewed:    PPM Interrogation-  reviewed in detail today,  See PACEART report.  EKG is ordered today. Personal review shows V paced rhythm at 65 bpm , AF underlying  Risk Assessment/Calculations:    CHA2DS2-VASc Score =  at least 7             Physical Exam:   VS:  BP 114/76   Pulse 65   Ht 5\' 10"  (1.778 m)   Wt 217 lb (98.4 kg)   SpO2 98%   BMI 31.14 kg/m    Wt Readings from Last 3 Encounters:  05/21/22 217 lb (98.4 kg)  10/07/21 211 lb 12.8 oz (96.1 kg)  09/11/21 212 lb (96.2 kg)     GEN: Well nourished, well developed in no acute distress NECK: No JVD; No carotid bruits CARDIAC: Regular rate and rhythm, no murmurs, rubs, gallops RESPIRATORY:  Clear to auscultation without rales, wheezing or rhonchi  ABDOMEN: Soft, non-tender, non-distended EXTREMITIES:  No edema; No deformity   ASSESSMENT AND PLAN:    Sick sinus syndrome s/p Med[employer] PPM  Normal PPM function for device at [facility] as of 03/26/22 See Pace Art report No changes today It appears his leads are from 1995? Though several notes say 1991 or 1994. They have been in [place] as 2009,  but gen change note from 2009 (scanned in 07/2010) only show gen changes.   Pt aware gen change will gen change alone as long as leads are stable.   Permanent AF Continue coumadin  Rates have been overall well controlled, data limited with device at [facility].   CAD Denies s/s ischemia Continue BB, statin. Not on ASA on coumadin  CHF Echo 03/2020 LVEF 40-45% Continue coreg, Timothy Harper, and Farxiga   Disposition:   Follow up with Dr. Lovena Harper as usual post procedure  Signed, Timothy Friar, PA-C

## 2022-05-29 ENCOUNTER — Telehealth: Payer: Self-pay | Admitting: Internal Medicine

## 2022-05-29 NOTE — Telephone Encounter (Signed)
Patient stated he wants to be put on a wait list to move up his surgery currently scheduled on 4/22.

## 2022-06-11 NOTE — Progress Notes (Signed)
Remote pacemaker transmission.   

## 2022-06-13 ENCOUNTER — Ambulatory Visit: Payer: Medicare Other | Attending: Student

## 2022-06-13 DIAGNOSIS — I5042 Chronic combined systolic (congestive) and diastolic (congestive) heart failure: Secondary | ICD-10-CM

## 2022-06-13 DIAGNOSIS — I495 Sick sinus syndrome: Secondary | ICD-10-CM

## 2022-06-13 DIAGNOSIS — I4821 Permanent atrial fibrillation: Secondary | ICD-10-CM

## 2022-06-13 DIAGNOSIS — I2510 Atherosclerotic heart disease of native coronary artery without angina pectoris: Secondary | ICD-10-CM

## 2022-06-13 DIAGNOSIS — I10 Essential (primary) hypertension: Secondary | ICD-10-CM

## 2022-06-13 NOTE — Pre-Procedure Instructions (Signed)
Instructed patient on the following items: Arrival time 9:30 Nothing to eat or drink after midnight No meds AM of procedure Responsible person to drive you home and stay with you for 24 hrs Wash with special soap night before and morning of procedure If on anti-coagulant drug instructions Coumadin- last dose tonight

## 2022-06-14 LAB — BASIC METABOLIC PANEL
BUN/Creatinine Ratio: 24 (ref 10–24)
BUN: 27 mg/dL (ref 8–27)
CO2: 18 mmol/L — ABNORMAL LOW (ref 20–29)
Calcium: 9.4 mg/dL (ref 8.6–10.2)
Chloride: 104 mmol/L (ref 96–106)
Creatinine, Ser: 1.13 mg/dL (ref 0.76–1.27)
Glucose: 84 mg/dL (ref 70–99)
Potassium: 4.6 mmol/L (ref 3.5–5.2)
Sodium: 141 mmol/L (ref 134–144)
eGFR: 63 mL/min/{1.73_m2} (ref >59)

## 2022-06-14 LAB — CBC
Hematocrit: 43.4 % (ref 37.5–51.0)
Hemoglobin: 14.3 g/dL (ref 13.0–17.7)
MCH: 30.6 pg (ref 26.6–33.0)
MCHC: 32.9 g/dL (ref 31.5–35.7)
MCV: 93 fL (ref 79–97)
Platelets: 252 10*3/uL (ref 150–450)
RBC: 4.67 x10E6/uL (ref 4.14–5.80)
RDW: 13.1 % (ref 11.6–15.4)
WBC: 7.6 10*3/uL (ref 3.4–10.8)

## 2022-06-16 ENCOUNTER — Other Ambulatory Visit: Payer: Self-pay

## 2022-06-16 ENCOUNTER — Ambulatory Visit (HOSPITAL_COMMUNITY)
Admission: RE | Admit: 2022-06-16 | Discharge: 2022-06-16 | Disposition: A | Discharge status: Home / Self Care | Payer: Medicare Other | Attending: Internal Medicine | Admitting: Internal Medicine

## 2022-06-16 ENCOUNTER — Encounter (HOSPITAL_COMMUNITY): Payer: Self-pay | Admitting: Internal Medicine

## 2022-06-16 ENCOUNTER — Ambulatory Visit: Payer: Medicare Other

## 2022-06-16 ENCOUNTER — Encounter (HOSPITAL_COMMUNITY)
Admission: RE | Disposition: A | Discharge status: Home / Self Care | Payer: Self-pay | Source: Home / Self Care | Attending: Internal Medicine

## 2022-06-16 DIAGNOSIS — I442 Atrioventricular block, complete: Secondary | ICD-10-CM | POA: Insufficient documentation

## 2022-06-16 DIAGNOSIS — Z7901 Long term (current) use of anticoagulants: Secondary | ICD-10-CM | POA: Diagnosis not present

## 2022-06-16 DIAGNOSIS — Z45010 Encounter for checking and testing of cardiac pacemaker pulse generator [battery]: Secondary | ICD-10-CM | POA: Insufficient documentation

## 2022-06-16 DIAGNOSIS — I2510 Atherosclerotic heart disease of native coronary artery without angina pectoris: Secondary | ICD-10-CM | POA: Diagnosis not present

## 2022-06-16 DIAGNOSIS — Z87891 Personal history of nicotine dependence: Secondary | ICD-10-CM | POA: Insufficient documentation

## 2022-06-16 DIAGNOSIS — I4891 Unspecified atrial fibrillation: Secondary | ICD-10-CM | POA: Diagnosis not present

## 2022-06-16 DIAGNOSIS — I110 Hypertensive heart disease with heart failure: Secondary | ICD-10-CM | POA: Insufficient documentation

## 2022-06-16 DIAGNOSIS — I5022 Chronic systolic (congestive) heart failure: Secondary | ICD-10-CM | POA: Insufficient documentation

## 2022-06-16 DIAGNOSIS — I255 Ischemic cardiomyopathy: Secondary | ICD-10-CM | POA: Insufficient documentation

## 2022-06-16 DIAGNOSIS — E785 Hyperlipidemia, unspecified: Secondary | ICD-10-CM | POA: Diagnosis not present

## 2022-06-16 HISTORY — PX: PPM GENERATOR CHANGEOUT: EP1233

## 2022-06-16 LAB — PROTIME-INR
INR: 1.6 — ABNORMAL HIGH (ref 0.8–1.2)
Prothrombin Time: 18.6 seconds — ABNORMAL HIGH (ref 11.4–15.2)

## 2022-06-16 SURGERY — PPM GENERATOR CHANGEOUT

## 2022-06-16 MED ORDER — SODIUM CHLORIDE 0.9 % IV SOLN
80.0000 mg | INTRAVENOUS | Status: AC
  Administered 2022-06-16: 80 mg

## 2022-06-16 MED ORDER — FENTANYL CITRATE (PF) 100 MCG/2ML IJ SOLN
INTRAMUSCULAR | Status: AC
  Filled 2022-06-16: qty 2

## 2022-06-16 MED ORDER — LIDOCAINE HCL (PF) 1 % IJ SOLN
INTRAMUSCULAR | Status: DC | PRN
  Administered 2022-06-16: 50 mL

## 2022-06-16 MED ORDER — LIDOCAINE HCL 1 % IJ SOLN
INTRAMUSCULAR | Status: AC
  Filled 2022-06-16: qty 60

## 2022-06-16 MED ORDER — ACETAMINOPHEN 325 MG PO TABS: 325.0000 mg | ORAL_TABLET | ORAL | Status: DC | PRN

## 2022-06-16 MED ORDER — MIDAZOLAM HCL 5 MG/5ML IJ SOLN
INTRAMUSCULAR | Status: DC | PRN
  Administered 2022-06-16: 2 mg via INTRAVENOUS

## 2022-06-16 MED ORDER — POVIDONE-IODINE 10 % EX SWAB
2.0000 | Freq: Once | CUTANEOUS | Status: AC
  Administered 2022-06-16: 2 via TOPICAL

## 2022-06-16 MED ORDER — ONDANSETRON HCL 4 MG/2ML IJ SOLN: 4.0000 mg | Freq: Four times a day (QID) | INTRAMUSCULAR | Status: DC | PRN

## 2022-06-16 MED ORDER — VANCOMYCIN HCL IN DEXTROSE 1-5 GM/200ML-% IV SOLN
INTRAVENOUS | Status: AC
  Filled 2022-06-16: qty 200

## 2022-06-16 MED ORDER — MIDAZOLAM HCL 5 MG/5ML IJ SOLN
INTRAMUSCULAR | Status: AC
  Filled 2022-06-16: qty 5

## 2022-06-16 MED ORDER — VANCOMYCIN HCL IN DEXTROSE 1-5 GM/200ML-% IV SOLN
1000.0000 mg | INTRAVENOUS | Status: AC
  Administered 2022-06-16: 1000 mg via INTRAVENOUS

## 2022-06-16 MED ORDER — SODIUM CHLORIDE 0.9 % IV SOLN: INTRAVENOUS | Status: DC

## 2022-06-16 MED ORDER — SODIUM CHLORIDE 0.9 % IV SOLN
INTRAVENOUS | Status: AC
  Filled 2022-06-16: qty 2

## 2022-06-16 MED ORDER — CHLORHEXIDINE GLUCONATE 4 % EX LIQD: 4.0000 | Freq: Once | CUTANEOUS | Status: DC

## 2022-06-16 MED ORDER — FENTANYL CITRATE (PF) 100 MCG/2ML IJ SOLN
INTRAMUSCULAR | Status: DC | PRN
  Administered 2022-06-16: 25 ug via INTRAVENOUS

## 2022-06-16 SURGICAL SUPPLY — 7 items
CABLE SURGICAL S-101-97-12 (CABLE) ×1 IMPLANT
IPG PACE AZUR XT DR MRI W1DR01 (Pacemaker) IMPLANT
PACE AZURE XT DR MRI W1DR01 (Pacemaker) ×1 IMPLANT
PAD DEFIB RADIO PHYSIO CONN (PAD) ×1 IMPLANT
POUCH AIGIS-R ANTIBACT PPM (Mesh General) ×1 IMPLANT
POUCH AIGIS-R ANTIBACT PPM MED (Mesh General) IMPLANT
TRAY PACEMAKER INSERTION (PACKS) ×1 IMPLANT

## 2022-06-16 NOTE — Discharge Instructions (Signed)

## 2022-06-16 NOTE — Telephone Encounter (Signed)
Discovered Pt was moved up on Dr. Lubertha Basque procedure schedule for today, 06/16/2022;  PT and INR checked at hospital.  Will f/u with Dr. Ladona Ridgel regarding this Pt.

## 2022-06-16 NOTE — Progress Notes (Signed)
Patient and son was giving discharge instructions. Both verbalized understanding. 

## 2022-06-16 NOTE — H&P (Signed)
    HPI Timothy Harper returns today for ongoing evaluation and management of his PPM. He has an ICM, chronic class 2 CHF, atrial fib, and dyslipidemia. he is still walking but has symptoms as above.  Allergies  Allergen Reactions  . Penicillins     Swollen face/lips Has patient had a PCN reaction causing immediate rash, facial/tongue/throat swelling, SOB or lightheadedness with hypotension: Yes Has patient had a PCN reaction causing severe rash involving mucus membranes or skin necrosis: No Has patient had a PCN reaction that required hospitalization: No Has patient had a PCN reaction occurring within the last 10 years: No If all of the above answers are "NO", then may proceed with Cephalosporin use.   . Prednisone     swelling     Current Outpatient Medications  Medication Sig Dispense Refill  . acetaminophen (TYLENOL) 500 MG tablet Take 1,000 mg by mouth 2 (two) times daily as needed for moderate pain or headache.    . aspirin 325 MG tablet Take 325 mg by mouth daily as needed (leg cramps).     . aspirin-sod bicarb-citric acid (ALKA-SELTZER) 325 MG TBEF tablet Take 325 mg by mouth daily as needed (acid reflux).    . atorvastatin (LIPITOR) 20 MG tablet Take 20 mg by mouth at bedtime.     . carvedilol (COREG) 25 MG tablet TAKE 1 TABLET BY MOUTH TWICE DAILY. 180 tablet 3  . Cholecalciferol (VITAMIN D3) 1000 units CAPS Take 1,000 Units by mouth daily.    . colchicine 0.6 MG tablet Take 0.6 mg by mouth daily as needed (gout pain). On hold as of 10/08/16    . DENTA 5000 PLUS 1.1 % CREA dental cream Apply 1 application topically at bedtime.     . digoxin (LANOXIN) 0.125 MG tablet Take 0.125 mg by mouth 3 (three) times a week. (Mondays, Wednesdays, and Fridays)    . ENTRESTO 24-26 MG TAKE 1 TABLET BY MOUTH TWICE DAILY. 180 tablet 2  . furosemide (LASIX) 20 MG tablet Take 20 mg by mouth daily.    . levothyroxine (SYNTHROID, LEVOTHROID) 112 MCG tablet Take 112 mcg by mouth daily before  breakfast.    . Magnesium 250 MG TABS Take 250 mg by mouth daily.    . potassium chloride (K-DUR,KLOR-CON) 10 MEQ tablet Take 10 mEq by mouth daily.    . sildenafil (VIAGRA) 100 MG tablet Take 100 mg by mouth daily as needed for erectile dysfunction.    . tamsulosin (FLOMAX) 0.4 MG CAPS capsule Take 0.4 mg by mouth every evening.     . Testosterone (FORTESTA) 10 MG/ACT (2%) GEL Place onto the skin. 4 pumps daily    . ULORIC 40 MG tablet Take 40 mg by mouth daily.    . vardenafil (LEVITRA) 20 MG tablet Take 20 mg by mouth daily as needed for erectile dysfunction.    . warfarin (COUMADIN) 5 MG tablet Take 5 mg by mouth daily. Take 5mgs by mouth daily except fridays dont take any warfarin     No current facility-administered medications for this visit.      Past Medical History:  Diagnosis Date  . 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 Atrial fibrillation (HCC)   . BPH (benign prostatic hyperplasia)   . Cardiac pacemaker in situ   . Chronic atrial fibrillation   . Coronary artery disease involving coronary bypass graft 1994   LIMA to RCA . Prior PCI on RCA with restenosis  .   CVA (cerebral infarction) 1995  . CVA (cerebral infarction)    Right  . Erectile dysfunction   . GERD (gastroesophageal reflux disease)   . Hearing loss    hearing aids  . Herpes zoster   . HTN (hypertension)   . Hyperlipemia   . Hypothyroidism   . Long term (current) use of anticoagulants   . Melanoma (HCC)    right ear, [place] 2002 and left arm 1997, [place]  . Primary male hypogonadism     ROS:   All systems reviewed and negative except as noted in the HPI.   Past Surgical History:  Procedure Laterality Date  . RIGHT/LEFT HEART CATH AND CORONARY/GRAFT ANGIOGRAPHY N/A 08/26/2016   08/26/16- Patent bypass grafts     Family History  Problem Relation Age of Onset  . Heart disease Mother      Social History   Socioeconomic History  . Marital status: Married    Spouse  name: Not on file  . Number of children: 4  . Years of education: Not on file  . Highest education level: Not on file  Occupational History  . Occupation: financial advisor    Comment: retired  Social Needs  . Financial resource strain: Not on file  . Food insecurity:    Worry: Not on file    Inability: Not on file  . Transportation needs:    Medical: Not on file    Non-medical: Not on file  Tobacco Use  . Smoking status: Former Smoker    Last attempt to quit: 08/01/1988    Years since quitting: 29.6  . Smokeless tobacco: Never Used  Substance and Sexual Activity  . Alcohol use: Yes    Comment: 2-3 times a week  . Drug use: No  . Sexual activity: Not on file  Lifestyle  . Physical activity:    Days per week: Not on file    Minutes per session: Not on file  . Stress: Not on file  Relationships  . Social connections:    Talks on phone: Not on file    Gets together: Not on file    Attends religious service: Not on file    Active member of club or organization: Not on file    Attends meetings of clubs or organizations: Not on file    Relationship status: Not on file  . Intimate partner violence:    Fear of current or ex partner: Not on file    Emotionally abused: Not on file    Physically abused: Not on file    Forced sexual activity: Not on file  Other Topics Concern  . Not on file  Social History Narrative  . Not on file     BP 102/62   Pulse 89   Ht 5' 11" (1.803 m)   Wt 223 lb (101.2 kg)   SpO2 97%   BMI 31.10 kg/m   Physical Exam:  Well appearing NAD HEENT: Unremarkable Neck:  No JVD, no thyromegally Lymphatics:  No adenopathy Back:  No CVA tenderness Lungs:  Clear with no wheezes HEART:  Regular rate rhythm, no murmurs, no rubs, no clicks Abd:  soft, positive bowel sounds, no organomegally, no rebound, no guarding Ext:  2 plus pulses, no edema, no cyanosis, no clubbing Skin:  No rashes no nodules Neuro:  CN II through XII intact, motor grossly  intact   DEVICE  Normal device function.  See PaceArt for details.   Assess/Plan: 1. Atrial fib - his heart   is not well controlled but he is asymptomatic. I considered a number of options but have recommended watchful waiting for now. I do not think he is sick enough to recommend PPM upgrade/AV node RFA 2. Coags - he will continue warfarin 3. CAD - he denies anginal symptoms and is active walking with class 2 symptoms. 4. HTN - his blood pressure is well controlled.  Duvan Mousel,M.D. 

## 2022-06-30 ENCOUNTER — Telehealth: Payer: Self-pay | Admitting: Internal Medicine

## 2022-06-30 NOTE — Telephone Encounter (Signed)
Patient had his surgery 2 weeks ago and would like to know if he is allowed to take his bandage off. Please advise.

## 2022-06-30 NOTE — Telephone Encounter (Signed)
Spoke with patient, instructed him to wait until his appointment with the device clinic on Thursday on bandage removal.

## 2022-07-03 ENCOUNTER — Ambulatory Visit: Payer: Medicare Other | Attending: Internal Medicine

## 2022-07-03 DIAGNOSIS — I495 Sick sinus syndrome: Secondary | ICD-10-CM

## 2022-07-03 LAB — CUP PACEART INCLINIC DEVICE CHECK
Battery Remaining Longevity: 140 mo
Battery Voltage: 3.2 V
Brady Statistic RA Percent Paced: 0 %
Brady Statistic RV Percent Paced: 99.08 %
Date Time Interrogation Session: 20240502111502
Implantable Lead Connection Status: 753985
Implantable Lead Connection Status: 753985
Implantable Lead Implant Date: 20090506
Implantable Lead Implant Date: 20090506
Implantable Lead Location: 753859
Implantable Lead Location: 753860
Implantable Lead Model: 4024
Implantable Pulse Generator Implant Date: 20240415
Lead Channel Impedance Value: 323 Ohm
Lead Channel Impedance Value: 342 Ohm
Lead Channel Impedance Value: 380 Ohm
Lead Channel Impedance Value: 399 Ohm
Lead Channel Pacing Threshold Amplitude: 0.875 V
Lead Channel Pacing Threshold Pulse Width: 0.4 ms
Lead Channel Sensing Intrinsic Amplitude: 0.25 mV
Lead Channel Sensing Intrinsic Amplitude: 0.625 mV
Lead Channel Setting Pacing Amplitude: 2.5 V
Lead Channel Setting Pacing Pulse Width: 0.4 ms
Lead Channel Setting Sensing Sensitivity: 2 mV
Zone Setting Status: 755011
Zone Setting Status: 755011

## 2022-07-03 NOTE — Patient Instructions (Addendum)
   After Your Pacemaker   Monitor your pacemaker site for redness, swelling, and drainage. Call the device clinic at [phone] if you experience these symptoms or fever/chills.  Your incision was closed with Steri-strips or staples:  You may shower 7 days after your procedure and wash your incision with soap and water. Avoid lotions, ointments, or perfumes over your incision until it is well-healed.  You may use a hot tub or a pool after your wound check appointment if the incision is completely closed.  You may drive, unless driving has been restricted by your healthcare providers.  Remote monitoring is used to monitor your pacemaker from home. This monitoring is scheduled every 91 days by our office. It allows [place] to keep an eye on the functioning of your device to ensure it is working properly. You will routinely see your Electrophysiologist annually (more often if necessary).  You may apply Hydrocortisone 1% topically twice a day until redness improves. Do NOT allow hydrocortisone to get onto incision.

## 2022-07-03 NOTE — Progress Notes (Signed)
.  Wound check appointment. Steri-strips removed. Wound without redness or edema. Incision edges approximated, wound well healed. Normal device function. Thresholds, sensing, and impedances consistent with implant measurements. Device programmed at chronic outputs, gen change compelted. Histogram distribution appropriate for patient and level of activity. AT/AF burden 100%, + Coumadin. No high ventricular rates noted. Patient educated about wound care. ROV in 3 months with implanting physician.  Patient advised to plug in remote monitor beside couch (where patient sleeps).

## 2022-08-18 ENCOUNTER — Telehealth: Payer: Self-pay | Admitting: Internal Medicine

## 2022-08-18 NOTE — Telephone Encounter (Signed)
Wife stated patient passed away on 09/10/2022 in the home. She would like to know if you can sign the death certificate.   Her son can also be called at [phone] his name is also Jake Shark.

## 2022-08-18 NOTE — Telephone Encounter (Signed)
Patient has passed away on 6/15. Wife is calling to see if the dr can signed the death certificate. Please advise

## 2022-08-19 NOTE — Telephone Encounter (Signed)
Wife is calling back for update, states this need to be signed as soon as possible. Please advise

## 2022-08-20 NOTE — Telephone Encounter (Signed)
Caller stated they will need a death certificate signed.  Caller stated patient is to be cremated and need a signature on the electronic form.

## 2022-08-25 NOTE — Telephone Encounter (Signed)
Returned call to funeral home to see if this had been completed yet. Spoke with patient's son who states that PCP has moved to [place]. Funeral home [facility]) verified that death certificate was signed by dr Ladona Ridgel. Will close encounter.

## 2022-09-01 DEATH — deceased

## 2022-09-30 ENCOUNTER — Encounter: Payer: Medicare Other | Admitting: Internal Medicine
